# Patient Record
Sex: Female | Born: 1935 | Race: White | Hispanic: No
Health system: Southern US, Community
[De-identification: ages and names within clinical notes are randomized; demographics above are authoritative.]

---

## 1997-09-11 ENCOUNTER — Ambulatory Visit (HOSPITAL_COMMUNITY): Admission: RE | Admit: 1997-09-11 | Discharge: 1997-09-11 | Payer: Self-pay | Admitting: Gastroenterology

## 1998-11-26 ENCOUNTER — Encounter: Payer: Self-pay | Admitting: Orthopedic Surgery

## 1998-12-03 ENCOUNTER — Inpatient Hospital Stay (HOSPITAL_COMMUNITY): Admission: RE | Admit: 1998-12-03 | Discharge: 1998-12-06 | Payer: Self-pay | Admitting: Orthopedic Surgery

## 1998-12-05 ENCOUNTER — Encounter: Payer: Self-pay | Admitting: Orthopedic Surgery

## 1999-03-17 ENCOUNTER — Other Ambulatory Visit: Admission: RE | Admit: 1999-03-17 | Discharge: 1999-03-17 | Payer: Self-pay | Admitting: Obstetrics and Gynecology

## 1999-04-17 ENCOUNTER — Encounter: Payer: Self-pay | Admitting: Family Medicine

## 1999-04-17 ENCOUNTER — Encounter: Admission: RE | Admit: 1999-04-17 | Discharge: 1999-04-17 | Payer: Self-pay | Admitting: Family Medicine

## 1999-04-22 ENCOUNTER — Encounter: Admission: RE | Admit: 1999-04-22 | Discharge: 1999-04-22 | Payer: Self-pay | Admitting: Obstetrics and Gynecology

## 1999-04-22 ENCOUNTER — Encounter: Payer: Self-pay | Admitting: Obstetrics and Gynecology

## 2000-08-15 ENCOUNTER — Emergency Department (HOSPITAL_COMMUNITY): Admission: EM | Admit: 2000-08-15 | Discharge: 2000-08-15 | Payer: Self-pay | Admitting: Emergency Medicine

## 2002-01-10 ENCOUNTER — Ambulatory Visit (HOSPITAL_COMMUNITY): Admission: RE | Admit: 2002-01-10 | Discharge: 2002-01-11 | Payer: Self-pay | Admitting: Cardiovascular Disease

## 2002-01-10 ENCOUNTER — Encounter: Payer: Self-pay | Admitting: Cardiovascular Disease

## 2002-02-14 ENCOUNTER — Encounter: Admission: RE | Admit: 2002-02-14 | Discharge: 2002-02-14 | Payer: Self-pay | Admitting: Family Medicine

## 2002-02-14 ENCOUNTER — Encounter: Payer: Self-pay | Admitting: Family Medicine

## 2002-03-07 ENCOUNTER — Encounter: Admission: RE | Admit: 2002-03-07 | Discharge: 2002-03-07 | Payer: Self-pay | Admitting: Orthopedic Surgery

## 2002-03-07 ENCOUNTER — Encounter: Payer: Self-pay | Admitting: Orthopedic Surgery

## 2003-08-28 ENCOUNTER — Inpatient Hospital Stay (HOSPITAL_COMMUNITY): Admission: AD | Admit: 2003-08-28 | Discharge: 2003-08-29 | Payer: Self-pay | Admitting: Emergency Medicine

## 2005-07-14 ENCOUNTER — Inpatient Hospital Stay (HOSPITAL_COMMUNITY): Admission: EM | Admit: 2005-07-14 | Discharge: 2005-07-23 | Payer: Self-pay | Admitting: Emergency Medicine

## 2005-08-13 ENCOUNTER — Inpatient Hospital Stay (HOSPITAL_COMMUNITY): Admission: EM | Admit: 2005-08-13 | Discharge: 2005-08-22 | Payer: Self-pay | Admitting: Emergency Medicine

## 2005-08-15 ENCOUNTER — Ambulatory Visit: Payer: Self-pay | Admitting: Infectious Diseases

## 2005-08-15 ENCOUNTER — Ambulatory Visit: Payer: Self-pay | Admitting: Internal Medicine

## 2005-08-19 ENCOUNTER — Encounter: Payer: Self-pay | Admitting: Internal Medicine

## 2005-09-30 ENCOUNTER — Ambulatory Visit: Payer: Self-pay | Admitting: Infectious Diseases

## 2005-10-22 ENCOUNTER — Inpatient Hospital Stay (HOSPITAL_COMMUNITY): Admission: EM | Admit: 2005-10-22 | Discharge: 2005-11-03 | Payer: Self-pay | Admitting: Emergency Medicine

## 2005-10-27 ENCOUNTER — Ambulatory Visit: Payer: Self-pay | Admitting: Physical Medicine & Rehabilitation

## 2006-01-12 ENCOUNTER — Inpatient Hospital Stay (HOSPITAL_COMMUNITY): Admission: EM | Admit: 2006-01-12 | Discharge: 2006-01-27 | Payer: Self-pay | Admitting: Orthopedic Surgery

## 2006-01-14 ENCOUNTER — Ambulatory Visit: Payer: Self-pay | Admitting: Infectious Diseases

## 2006-03-23 ENCOUNTER — Inpatient Hospital Stay (HOSPITAL_COMMUNITY): Admission: RE | Admit: 2006-03-23 | Discharge: 2006-03-31 | Payer: Self-pay | Admitting: Orthopedic Surgery

## 2006-04-18 IMAGING — CR DG CHEST 1V PORT
1 series · 1 of 1 positions shown · non-contrast
Comparison: The right PICC line tip is in the distal SVC.  Heart and lungs are stable.

CLINICAL DATA: 77 year-old-female with infected pacemaker, PICC line placement. 
 PORTABLE CHEST - [id number] ? 08/19/05 (8626 HOURS):

[view not recorded]
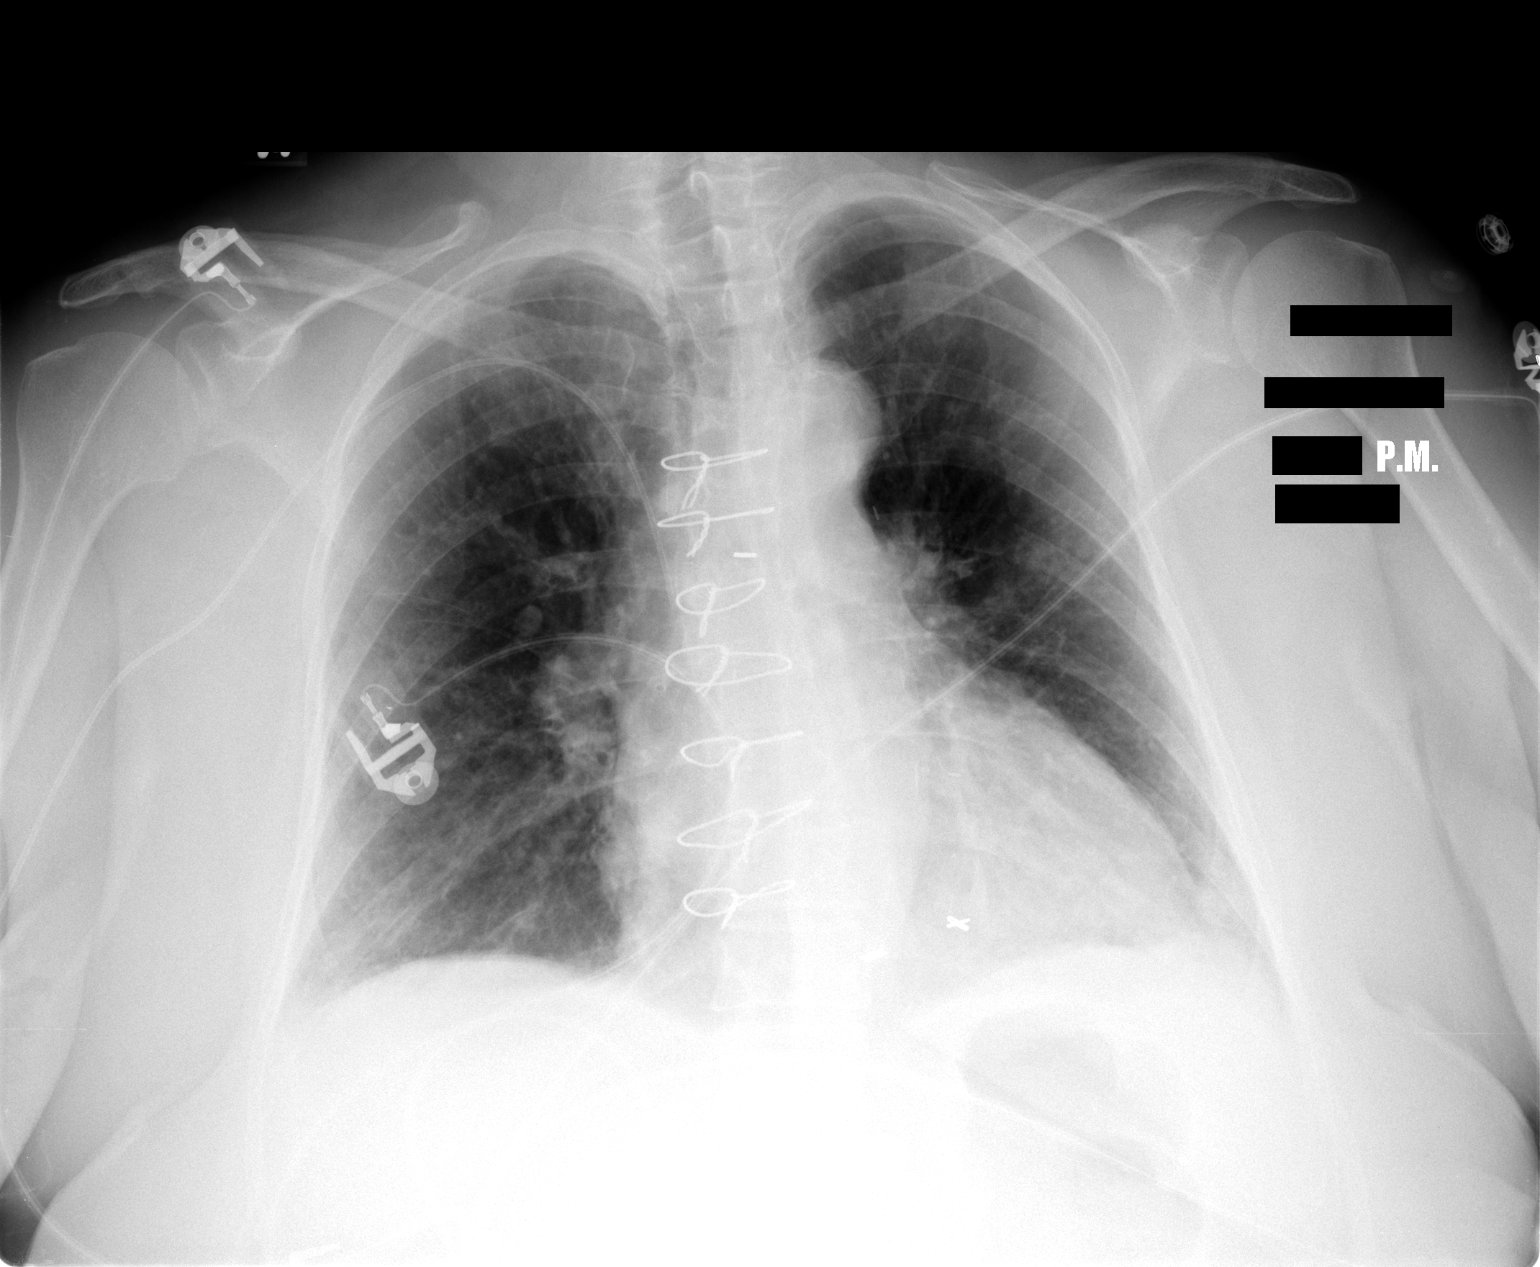

[1 of 1 positions shown; findings below may reference images not displayed]

IMPRESSION: PICC line tip in the distal SVC.

## 2006-04-18 IMAGING — CR DG CHEST 2V
2 series · 2 of 2 positions shown · non-contrast
Comparison: 08/12/05.

CLINICAL DATA: Status post infected pacemaker.  Soreness in chest. 
 CHEST ? 2 VIEW:

[w chest pa]
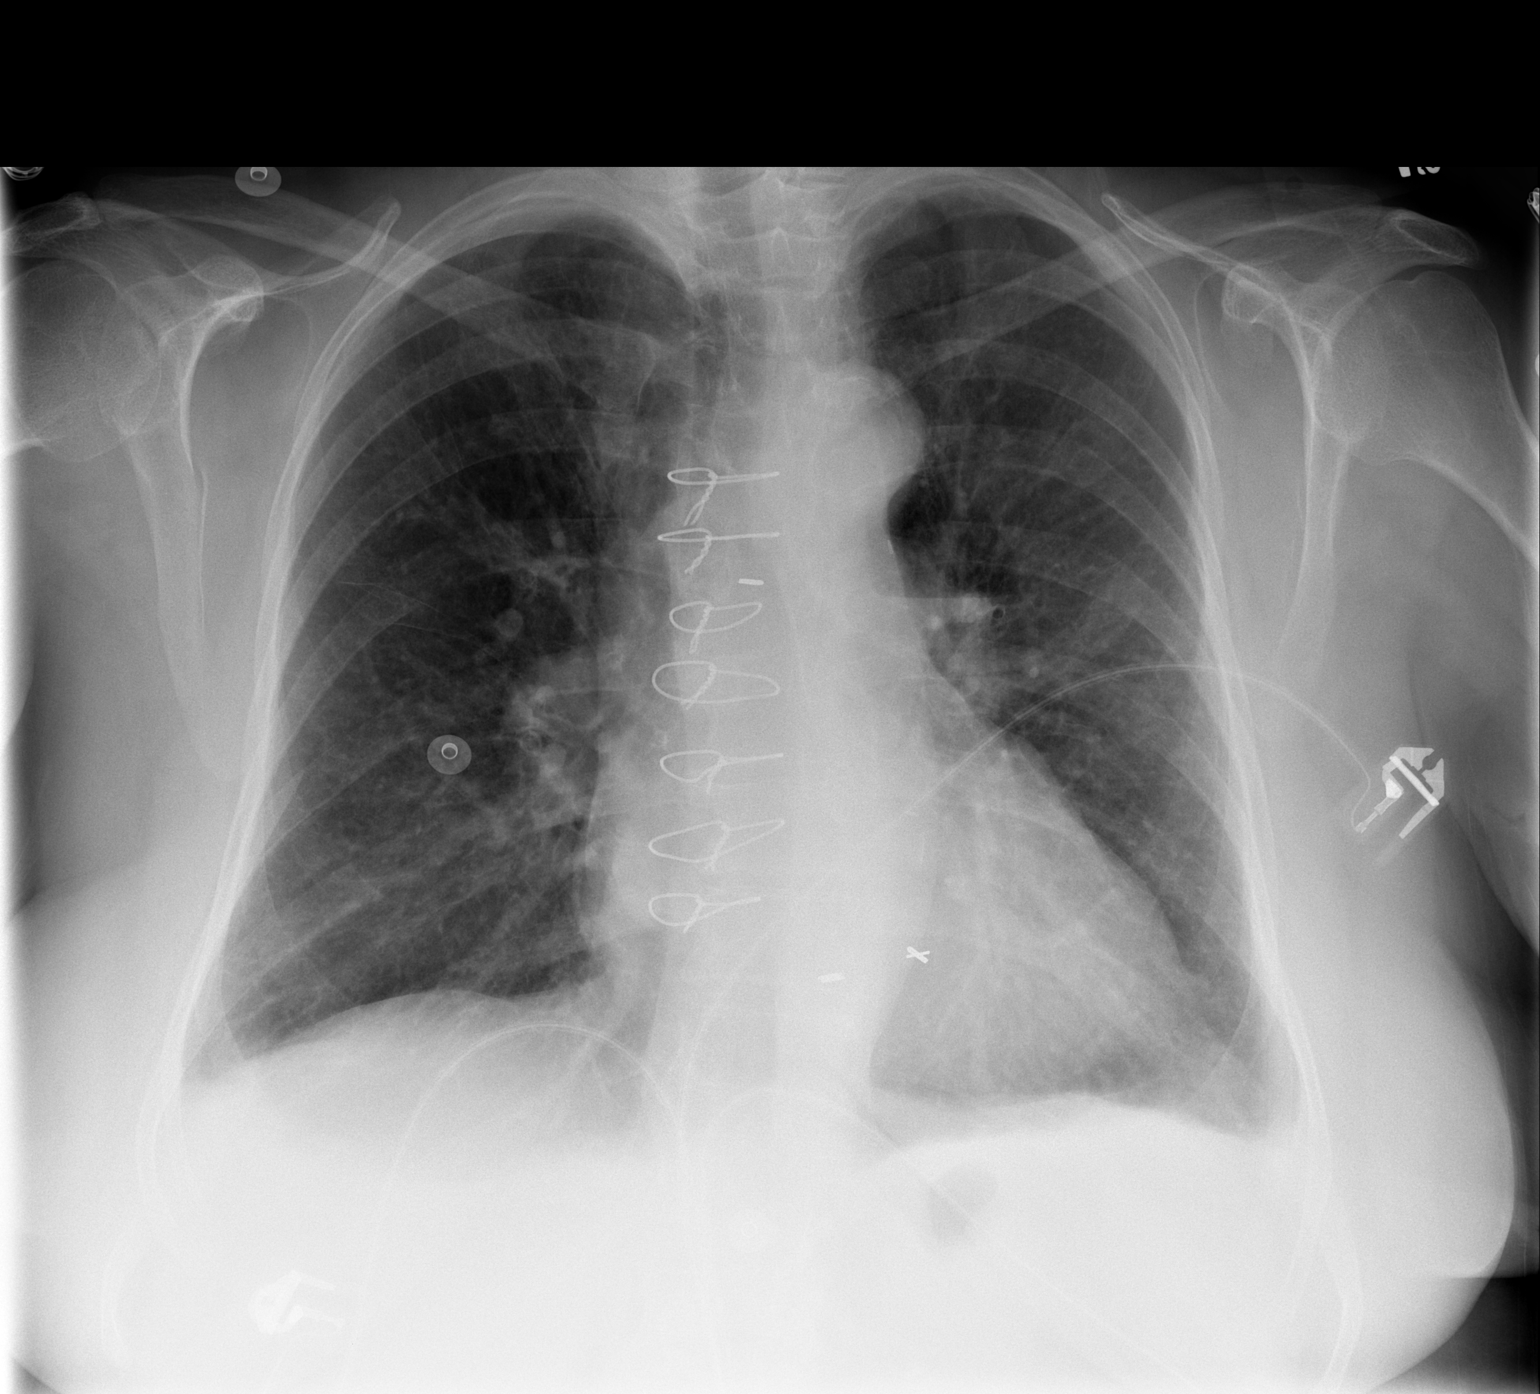

[w chest lat]
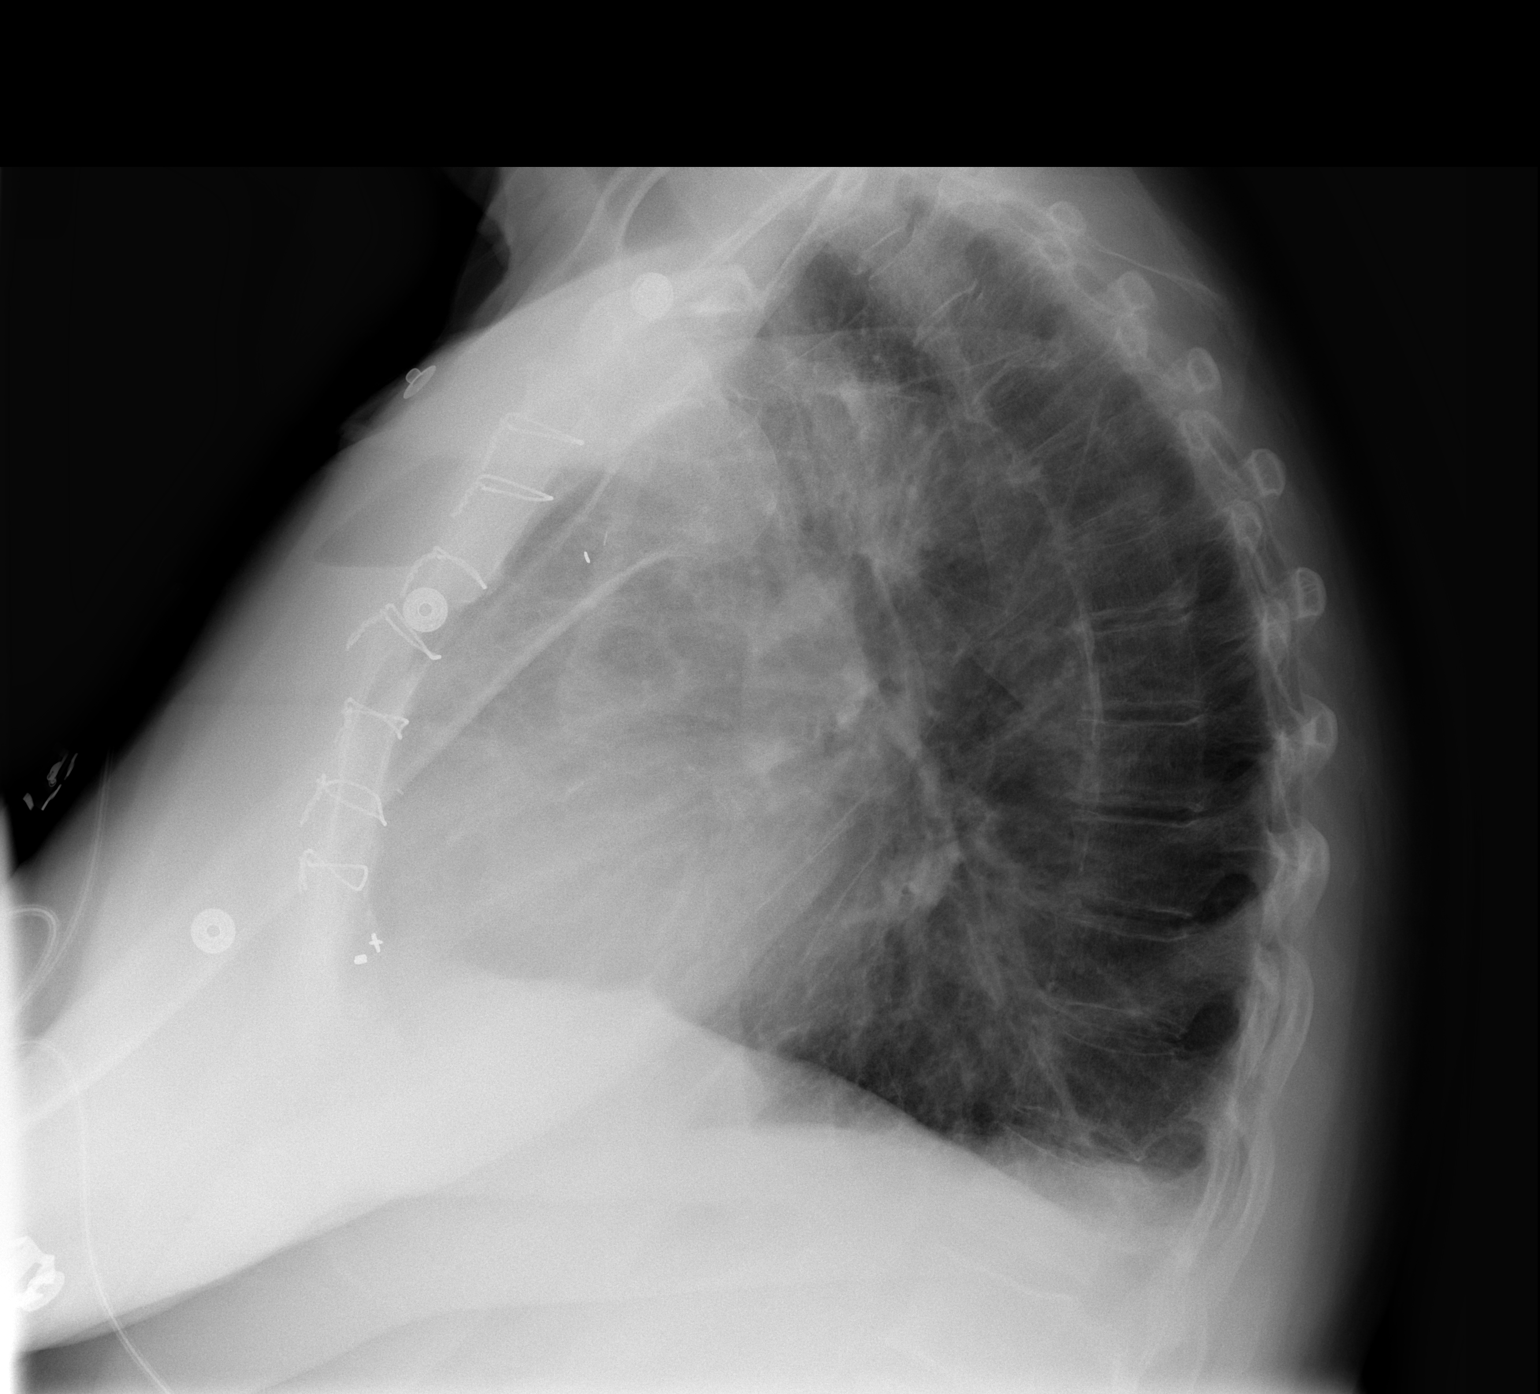

[2 of 2 positions shown; findings below may reference images not displayed]

FINDINGS: Pacemaker and electrodes have been removed.  Cardiomegaly.  Some redistribution of pulmonary blood flow to the upper lung zones compatible with mild pulmonary venous hypertension.  Accentuated peribronchial marking.  No airspace densities.  Blunting of the left posterior costophrenic angle compatible with small effusion.
IMPRESSION: Cardiomegaly and pulmonary venous hypertension.  Minimal left pleural effusion.

## 2006-08-18 DIAGNOSIS — E785 Hyperlipidemia, unspecified: Secondary | ICD-10-CM

## 2006-08-18 DIAGNOSIS — I10 Essential (primary) hypertension: Secondary | ICD-10-CM | POA: Insufficient documentation

## 2006-08-18 DIAGNOSIS — Z9889 Other specified postprocedural states: Secondary | ICD-10-CM

## 2006-08-18 DIAGNOSIS — I4891 Unspecified atrial fibrillation: Secondary | ICD-10-CM | POA: Insufficient documentation

## 2006-08-18 DIAGNOSIS — I359 Nonrheumatic aortic valve disorder, unspecified: Secondary | ICD-10-CM | POA: Insufficient documentation

## 2006-08-18 DIAGNOSIS — E119 Type 2 diabetes mellitus without complications: Secondary | ICD-10-CM | POA: Insufficient documentation

## 2006-08-18 DIAGNOSIS — I25810 Atherosclerosis of coronary artery bypass graft(s) without angina pectoris: Secondary | ICD-10-CM

## 2007-11-02 ENCOUNTER — Inpatient Hospital Stay (HOSPITAL_COMMUNITY): Admission: EM | Admit: 2007-11-02 | Discharge: 2007-11-13 | Payer: Self-pay | Admitting: Emergency Medicine

## 2008-08-09 ENCOUNTER — Encounter: Admission: RE | Admit: 2008-08-09 | Discharge: 2008-08-09 | Payer: Self-pay | Admitting: Internal Medicine

## 2009-08-09 ENCOUNTER — Inpatient Hospital Stay (HOSPITAL_COMMUNITY): Admission: AD | Admit: 2009-08-09 | Discharge: 2009-08-15 | Payer: Self-pay | Admitting: Cardiovascular Disease

## 2010-08-29 LAB — URINALYSIS, ROUTINE W REFLEX MICROSCOPIC
Glucose, UA: 100 mg/dL — AB
Glucose, UA: NEGATIVE mg/dL
Hgb urine dipstick: NEGATIVE
Ketones, ur: NEGATIVE mg/dL
Ketones, ur: NEGATIVE mg/dL
Protein, ur: NEGATIVE mg/dL
Specific Gravity, Urine: 1.011 (ref 1.005–1.030)
pH: 5 (ref 5.0–8.0)
pH: 5 (ref 5.0–8.0)

## 2010-08-29 LAB — GLUCOSE, CAPILLARY
Glucose-Capillary: 102 mg/dL — ABNORMAL HIGH (ref 70–99)
Glucose-Capillary: 110 mg/dL — ABNORMAL HIGH (ref 70–99)
Glucose-Capillary: 121 mg/dL — ABNORMAL HIGH (ref 70–99)
Glucose-Capillary: 122 mg/dL — ABNORMAL HIGH (ref 70–99)
Glucose-Capillary: 124 mg/dL — ABNORMAL HIGH (ref 70–99)
Glucose-Capillary: 124 mg/dL — ABNORMAL HIGH (ref 70–99)
Glucose-Capillary: 132 mg/dL — ABNORMAL HIGH (ref 70–99)
Glucose-Capillary: 143 mg/dL — ABNORMAL HIGH (ref 70–99)
Glucose-Capillary: 143 mg/dL — ABNORMAL HIGH (ref 70–99)
Glucose-Capillary: 148 mg/dL — ABNORMAL HIGH (ref 70–99)
Glucose-Capillary: 164 mg/dL — ABNORMAL HIGH (ref 70–99)
Glucose-Capillary: 169 mg/dL — ABNORMAL HIGH (ref 70–99)
Glucose-Capillary: 176 mg/dL — ABNORMAL HIGH (ref 70–99)
Glucose-Capillary: 181 mg/dL — ABNORMAL HIGH (ref 70–99)
Glucose-Capillary: 205 mg/dL — ABNORMAL HIGH (ref 70–99)
Glucose-Capillary: 205 mg/dL — ABNORMAL HIGH (ref 70–99)

## 2010-08-29 LAB — DIFFERENTIAL
Basophils Absolute: 0 10*3/uL (ref 0.0–0.1)
Basophils Relative: 0 % (ref 0–1)
Eosinophils Absolute: 0 10*3/uL (ref 0.0–0.7)
Eosinophils Relative: 0 % (ref 0–5)
Monocytes Absolute: 0.7 10*3/uL (ref 0.1–1.0)
Monocytes Relative: 8 % (ref 3–12)
Neutro Abs: 8.1 10*3/uL — ABNORMAL HIGH (ref 1.7–7.7)

## 2010-08-29 LAB — COMPREHENSIVE METABOLIC PANEL
ALT: 10 U/L (ref 0–35)
Albumin: 3.5 g/dL (ref 3.5–5.2)
Alkaline Phosphatase: 68 U/L (ref 39–117)
BUN: 24 mg/dL — ABNORMAL HIGH (ref 6–23)
Chloride: 106 mEq/L (ref 96–112)
Potassium: 5.7 mEq/L — ABNORMAL HIGH (ref 3.5–5.1)
Sodium: 134 mEq/L — ABNORMAL LOW (ref 135–145)
Total Bilirubin: 0.6 mg/dL (ref 0.3–1.2)
Total Protein: 6.4 g/dL (ref 6.0–8.3)

## 2010-08-29 LAB — CBC
HCT: 34.9 % — ABNORMAL LOW (ref 36.0–46.0)
Hemoglobin: 10.3 g/dL — ABNORMAL LOW (ref 12.0–15.0)
Hemoglobin: 10.5 g/dL — ABNORMAL LOW (ref 12.0–15.0)
Hemoglobin: 10.6 g/dL — ABNORMAL LOW (ref 12.0–15.0)
Hemoglobin: 11.7 g/dL — ABNORMAL LOW (ref 12.0–15.0)
MCHC: 33.9 g/dL (ref 30.0–36.0)
MCHC: 33.9 g/dL (ref 30.0–36.0)
MCHC: 34.2 g/dL (ref 30.0–36.0)
MCHC: 34.5 g/dL (ref 30.0–36.0)
MCV: 105 fL — ABNORMAL HIGH (ref 78.0–100.0)
MCV: 105.4 fL — ABNORMAL HIGH (ref 78.0–100.0)
MCV: 105.6 fL — ABNORMAL HIGH (ref 78.0–100.0)
Platelets: 221 10*3/uL (ref 150–400)
Platelets: 224 10*3/uL (ref 150–400)
Platelets: 275 10*3/uL (ref 150–400)
RBC: 2.77 MIL/uL — ABNORMAL LOW (ref 3.87–5.11)
RBC: 2.93 MIL/uL — ABNORMAL LOW (ref 3.87–5.11)
RBC: 3 MIL/uL — ABNORMAL LOW (ref 3.87–5.11)
RDW: 16.3 % — ABNORMAL HIGH (ref 11.5–15.5)
RDW: 16.8 % — ABNORMAL HIGH (ref 11.5–15.5)
RDW: 16.8 % — ABNORMAL HIGH (ref 11.5–15.5)
RDW: 17.1 % — ABNORMAL HIGH (ref 11.5–15.5)
RDW: 17.4 % — ABNORMAL HIGH (ref 11.5–15.5)
WBC: 6 10*3/uL (ref 4.0–10.5)
WBC: 6.8 10*3/uL (ref 4.0–10.5)
WBC: 8.3 10*3/uL (ref 4.0–10.5)
WBC: 9.6 10*3/uL (ref 4.0–10.5)
WBC: 9.9 10*3/uL (ref 4.0–10.5)

## 2010-08-29 LAB — BASIC METABOLIC PANEL
BUN: 22 mg/dL (ref 6–23)
BUN: 24 mg/dL — ABNORMAL HIGH (ref 6–23)
CO2: 20 mEq/L (ref 19–32)
CO2: 25 mEq/L (ref 19–32)
CO2: 26 mEq/L (ref 19–32)
Calcium: 9.3 mg/dL (ref 8.4–10.5)
Calcium: 9.3 mg/dL (ref 8.4–10.5)
Calcium: 9.4 mg/dL (ref 8.4–10.5)
Calcium: 9.4 mg/dL (ref 8.4–10.5)
Calcium: 9.5 mg/dL (ref 8.4–10.5)
Chloride: 102 mEq/L (ref 96–112)
Chloride: 105 mEq/L (ref 96–112)
Chloride: 111 mEq/L (ref 96–112)
Creatinine, Ser: 1.18 mg/dL (ref 0.4–1.2)
Creatinine, Ser: 1.22 mg/dL — ABNORMAL HIGH (ref 0.4–1.2)
Creatinine, Ser: 1.23 mg/dL — ABNORMAL HIGH (ref 0.4–1.2)
GFR calc Af Amer: 27 mL/min — ABNORMAL LOW (ref >60)
GFR calc Af Amer: 42 mL/min — ABNORMAL LOW (ref >60)
GFR calc Af Amer: 49 mL/min — ABNORMAL LOW (ref >60)
GFR calc Af Amer: 51 mL/min — ABNORMAL LOW (ref >60)
GFR calc Af Amer: 51 mL/min — ABNORMAL LOW (ref >60)
GFR calc Af Amer: 53 mL/min — ABNORMAL LOW (ref >60)
GFR calc non Af Amer: 23 mL/min — ABNORMAL LOW (ref >60)
GFR calc non Af Amer: 26 mL/min — ABNORMAL LOW (ref >60)
GFR calc non Af Amer: 35 mL/min — ABNORMAL LOW (ref >60)
Glucose, Bld: 111 mg/dL — ABNORMAL HIGH (ref 70–99)
Glucose, Bld: 122 mg/dL — ABNORMAL HIGH (ref 70–99)
Potassium: 5.2 mEq/L — ABNORMAL HIGH (ref 3.5–5.1)
Sodium: 133 mEq/L — ABNORMAL LOW (ref 135–145)
Sodium: 134 mEq/L — ABNORMAL LOW (ref 135–145)
Sodium: 138 mEq/L (ref 135–145)
Sodium: 140 mEq/L (ref 135–145)

## 2010-08-29 LAB — URINE MICROSCOPIC-ADD ON

## 2010-08-29 LAB — URINE CULTURE: Colony Count: NO GROWTH

## 2010-08-29 LAB — HEPARIN LEVEL (UNFRACTIONATED): Heparin Unfractionated: 0.5 IU/mL (ref 0.30–0.70)

## 2010-08-29 LAB — CARDIAC PANEL(CRET KIN+CKTOT+MB+TROPI)
CK, MB: 0.9 ng/mL (ref 0.3–4.0)
CK, MB: 0.9 ng/mL (ref 0.3–4.0)
Relative Index: INVALID (ref 0.0–2.5)
Total CK: 18 U/L (ref 7–177)
Total CK: 25 U/L (ref 7–177)

## 2010-08-29 LAB — BRAIN NATRIURETIC PEPTIDE
Pro B Natriuretic peptide (BNP): 206 pg/mL — ABNORMAL HIGH (ref 0.0–100.0)
Pro B Natriuretic peptide (BNP): 224 pg/mL — ABNORMAL HIGH (ref 0.0–100.0)
Pro B Natriuretic peptide (BNP): 225 pg/mL — ABNORMAL HIGH (ref 0.0–100.0)
Pro B Natriuretic peptide (BNP): 365 pg/mL — ABNORMAL HIGH (ref 0.0–100.0)
Pro B Natriuretic peptide (BNP): 429 pg/mL — ABNORMAL HIGH (ref 0.0–100.0)

## 2010-08-29 LAB — APTT: aPTT: 97 seconds — ABNORMAL HIGH (ref 24–37)

## 2010-08-29 LAB — PROTIME-INR
INR: 1.06 (ref 0.00–1.49)
Prothrombin Time: 13.7 seconds (ref 11.6–15.2)

## 2010-08-29 LAB — TROPONIN I: Troponin I: 0.03 ng/mL (ref 0.00–0.06)

## 2010-08-29 LAB — TSH: TSH: 2.247 u[IU]/mL (ref 0.350–4.500)

## 2010-08-29 LAB — HEMOGLOBIN A1C: Mean Plasma Glucose: 151 mg/dL

## 2010-08-29 LAB — MRSA PCR SCREENING: MRSA by PCR: NEGATIVE

## 2010-10-21 NOTE — Consult Note (Signed)
[name], [name]               ACCOUNT NO.:  [id number]   MEDICAL RECORD NO.:  [id number]          PATIENT TYPE:  INP   LOCATION:  2010                         FACILITY:  [facility]   PHYSICIAN:  Deanna Artis. Hickling, M.D.DATE OF BIRTH:  10-16-1927   DATE OF CONSULTATION:  11/07/2007  DATE OF DISCHARGE:                                 CONSULTATION   CHIEF COMPLAINT:  1. Altered mental status.  2. Problems with language.   I was asked to see Evelin Cake, a 77 year old widowed woman who  previously was seen at [facility] by my partner, Dr.  Fransisca Connors, April 15, 2001.  At that time, she had problems with  her balance, tendency to fall, and tremors that affected her hands, more  so, than her body.   He concluded that the patient had benign essential tremor and that she  had organic gait disorder, in part related to diabetic neuropathy.   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  This occurred during a coronary  artery stent placement.  The patient had made good recovery from that.   The patient was recently seen in Dr. Kandis Cocking office, Nov 02, 2007.  She complained of a 75-month history of shortness of breath, paroxysmal  nocturnal dyspnea, and was sleeping in recliner.  She complained of low  energy and lower extremity swelling.   During the examination, the patient became less coherent and her  information was inconsistent.  This confusion persisted in the hospital.  Indeed, the patient had difficulty talking about her medications and in  particular, had confusion about whether or not she was on insulin.   As result of this, I was asked to see her to determine the etiology of  her dysfunction and make recommendations for further workup and  treatment.   The patient has a number of risk factors for stroke including atrial  fibrillation, sick sinus syndrome, hypertension,  dyslipidemia, type 2  diabetes mellitus, and valvular heart disease with aortic stenosis and  moderate mitral valvular regurgitation.   The patient has other medical problems including a staph infection, that  forced removal of her pacemaker.  The patient has had a fairly stable  heart rhythm since that time, but has shown very long pauses during this  hospitalization, ranging from 2.6 seconds to somewhat over 3 seconds.   REVIEW OF SYSTEMS:  Remarkable for lightheadedness and near syncope.  She has not had intercurrent infections to head, neck lungs, GI, GU,  rash, or any new bruise, diabetes, or thyroid disease.  No dysuria,  hematuria, urgency, increased frequency of urination, cough, nasal  congestion, sore throat, or fever.   Twelve-system review was otherwise negative.   MEDICATIONS:  1. Cardizem 180 mg daily.  2. Avapro 300 mg daily.  3. Lasix 80 mg daily.  4. Lexapro 10 mg daily.  5. Protonix 40 mg daily.  6. Nu-Iron 150 mg twice daily.  7. Potassium chloride 10 mEq twice daily.  8. Metformin 1000 mg twice daily.  9. Glipizide  10 mg daily.  10.Crestor 20 mg daily.  11.Coumadin 5 mg, Saturday, Tuesday, and Thursday and 2.5 mg, Monday,      Wednesday, Friday, and Sunday.  12.Flonase.  13.Albuterol inhaler.  14.Vicodin 5/500 mg p.r.n.  15.Imdur 15 mg daily.   ALLERGIES:  PENICILLIN.   FAMILY HISTORY:  Positive for coronary artery disease, stroke, and  hypertension.   SOCIAL HISTORY:  The patient is widowed.  Her husband died in the 58s.  The patient has 2 children, 2 grandchildren, and 1 great grandchild.  She does not exercise.  She does not smoke and does not use alcohol or  drugs.   PHYSICAL EXAMINATION:  GENERAL:  On examination today, this is a  pleasant woman sitting in recliner in no distress.  VITAL SIGNS:  Blood pressure 110/60, resting pulse 63, respirations 18,  temperature 97.7, and oxygen saturation 97% on room air.  EARS, NOSE, AND THROAT:  No  infections.  LUNGS:  Clear.  HEART:  No murmurs.  Pulses normal.  ABDOMEN:  Soft.  Bowel sounds are normal.  No hepatosplenomegaly.  EXTREMITIES:  Normal.  NEUROLOGIC:  Mental status:  The patient had mini-mental status exam of  27/30, clock drawing 5/5, and animal naming 7 or 17 is normal.  Cranial  nerves:  Status post bilateral iridectomies.  Symmetric facial strength.  Visual fields full.  Extraocular movements full.  Symmetric facial  strength.  Midline tongue.  Motor examination:  Normal strength, tone,  and mass.  Good fine motor movements.  No pronator drift.  Sensation  showed a mild peripheral neuropathy.  No hemisensory.  Good  stereoagnosis.  Cerebellar examination:  Good finger-to-nose, rapid  alternating movements.  Gait was not tested.  Deep tendon reflexes were  absent.  The patient had bilateral flexor plantar responses.   IMPRESSION:  1. 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  2. I doubt that she has had a stroke.  Her NIH stroke scale is 0.  3. CT scan was reviewed and suggests a remote right posterior frontal      infarction and a left anterior limb of the internal capsule      infarction.  4. She has diffuse white matter disease and mild cortical atrophy.   RECOMMENDATIONS:  MRI will reveal that the patient has had a silent  acute subacute stroke.  She has multiple risk factors for stroke, that I  have noted above.  Once the pacemaker is placed and it may be necessary  to replace it, an MRI would be impossible.  She should also have an MRA  if MRI shows evidence of stroke.   I appreciate the opportunity to participate in her care.  If you have  any questions or I could be of assistance, do not hesitate to contact  me.      Deanna Artis. Sharene Skeans, M.D.  Electronically Signed     WHH/MEDQ  D:  11/07/2007  T:  11/08/2007  Job:  [id number]   cc:   Gerlene Burdock A. Alanda Amass, M.D.

## 2010-10-21 NOTE — Discharge Summary (Signed)
NAMEDONNALYNN, WHEELESS               ACCOUNT NO.:  [id number]   MEDICAL RECORD NO.:  [id number]          PATIENT TYPE:  INP   LOCATION:  2010                         FACILITY:  [facility]   PHYSICIAN:  Ritta Slot, MD     DATE OF BIRTH:  Jun 04, 1928   DATE OF ADMISSION:  11/02/2007  DATE OF DISCHARGE:  11/13/2007                               DISCHARGE SUMMARY   Ms. Magner is a 77 year old patient of Dr. Susa Griffins, who was  seen in the office and admitted for altered mental status and acute on  chronic CHF.  It was felt, she may have had a CVA with a prior medical  history of CVA.  She was admitted.  Her BNP was not elevated.  A neuro  consult was called.  She underwent an MRI, which did not show any acute  stroke.  She did have small vessel disease.  She has a history of  chronic atrial fib with sick sinus syndrome.  In the hospital, she had  tachybrady syndrome with 3-second and greater pauses.  We tried to  adjust her medications; however, it was just not able to be controlled,  thus it was decided that she should undergo pacemaker implantation.  Previously, she did have a pacemaker implanted; however, she apparently  got infected and had to be explanted, that was in July 21, 2005,  removal was on August 18, 2005.  She underwent pacemaker insertion on  November 11, 2007, by Dr. Ritta Slot.  She had a [employer] placed,  single chamber PPM on the left postsubclavian area.  Post procedure, she  did well.  She was placed on metoprolol and diltiazem to control her  rapid rate.  We had obtained a urine culture prior to her pacemaker  insertion.  It came back positive for Enterococcus.  She received about  3 days of vancomycin before and after her pacemaker inserted and she was  sent home on Septra DS for a total of 7 days.  We told her to restart  her Coumadin on November 15, 2007, had a lower dose of 2.5 mg every day.  She  will see Dr. Lynnea Ferrier back for just pacer and wound site  check, and then  she will follow up with a regular cardiologist, Dr. Alanda Amass after  that.  On the day of discharge, her blood pressure was 111/60 and heart  rate was 72.  She was ventricular pacing on demand, respirations in the  18 and temperature 97.1.   LABORATORY DATA:  Urine culture was greater than a 100,000 for  Enterococcus.  Sensitivities were pending at the time of discharge.  Sodium was 140, potassium 4.0, chloride 109, CO2 of 28, BUN 20, and  creatinine 1.18.  Her hemoglobin was 10.9, hematocrit 32.6, WBCs 8.3,  and platelets were 255.  Homocystine was elevated at 23.1 and hemoglobin  A1c was 7.2.  TSH was 2.681.  CK-MBs and troponins were all negative.  Magnesium was 2.5.   DISCHARGE MEDICATIONS:  1. Avapro 300 mg a day.  2. Lexapro 10 mg a day.  3. Nu-Iron  150 mg twice per day.  4. K-dur 10 mEq twice a day.  5. Crestor 20 mg a day.  6. Protonix 40 mg a day.  7. Metformin 1000 mg twice a day.  8. Imdur 15 mg a day.  9. Glucotrol 10 mg a day.  10.Furosemide 60 mg a day.  11.Metoprolol 25 mg twice per day.  12.Diltiazem ER 120 mg daily.  13.Bactrim DS 1 twice per day x5 days.  14.She is to restart her Coumadin/warfarin in the night of November 15, 2007.  She should start taking 2.5 mg a day.  She should have her      protime checked on November 21, 2007.  She will see Dr. Lynnea Ferrier back on      November 21, 2007, just for wound site check, after that she will see      Dr. Alanda Amass in followup.  If she has any problems we told her to      call.  She was given a pacemaker instruction sheet and this was      explained to her and her son, her medicines, etc.  Her son does fix      her medicines.  In followup, she should be placed on some Foltx      because of the elevated homocysteine of 23.   DISCHARGE DIAGNOSES:  1. Altered mental status, resolved at time of discharge, seen by      Neurology.  No acute stroke.  MRI done on November 08, 2007, which      showed no acute infarct.   It showed atrophy, moderate small vessel      disease, nonspecific small area of blood breakdown products, right      occipital lobe, as noted above.  2. Chronic atrial fibrillation with sick sinus syndrome, status post      permanent pacemaker by Dr. Ritta Slot.  She had a [employer] Jude      single chamber implanted.  3. Urinary tract infection with greater than 100,000 Enterococcus.      Sensitivities are pending at the time of this discharge and she is      placed on Septra DS for a total of 7 days.  She did receive 3 days      of vancomycin in the hospital.  4. Acute on chronic diastolic congestive heart failure, resolved at      time of discharge.  5. History of infected permanent pacemaker and explantation in 2007.  6. Normal ejection fraction.  7. History of coronary artery disease with history of coronary artery      bypass grafting in 1989.      Lezlie Octave, N.P.      Ritta Slot, MD  Electronically Signed    BB/MEDQ  D:  11/13/2007  T:  11/14/2007  Job:  [id number]

## 2010-10-21 NOTE — Op Note (Signed)
[name], Lowe               ACCOUNT NO.:  [id number]   MEDICAL RECORD NO.:  [id number]          PATIENT TYPE:  INP   LOCATION:  2010                         FACILITY:  [facility]   PHYSICIAN:  Ritta Slot, MD     DATE OF BIRTH:  May 24, 1928   DATE OF PROCEDURE:  11/11/2007  DATE OF DISCHARGE:                               OPERATIVE REPORT   This is a single chamber permanent pacemaker insertion.   ATTENDING PHYSICIAN:  Ritta Slot, MD   INDICATIONS:  [name] is a very pleasant 77 year old female,  patient of Dr. Susa Griffins, who has chronic atrial fibrillation  and was admitted to the hospital on Nov 02, 2007, with mild confusion.  During her hospitalization, [name] was found to have rapid atrial  fibrillation with significant pauses of up to 4 seconds.  Treatment of  her atrial fibrillation was found to be challenging with negative  inotropic agents in view of the fact that [name] had pauses.  [name] was  therefore brought to the cardiac catheterization laboratory for single  chamber permanent pacemaker insertion.   Of note, [name] has had prior permanent pacemaker insertion in 2007 for the  same but had to have explantation of the pacemaker within a month due to  infection of the pacemaker and leads.   DESCRIPTION OF OPERATION:  After informed consent, the patient was then  brought to cardiac cath lab where the left chest was prepped and draped  in sterile fashion.  ECG monitoring was established.  Venography was  performed to identify that the left subclavian vein was in fact patent  despite having had a pacemaker placed 2 years ago.  Lidocaine 1% was  then used in each side of the left mid subclavicular area.  [name] was  given 5 mg of diazepam and 25 mcg fentanyl intravenously for light  sedation.  Next, approximately 3-cm horizontal and mid infraclavicular  incision was then carried out and hemostasis was obtained with  electrocautery.  Blunt dissection was used  that was carried down to  pectoral fascia.  Next, approximately 3 x 4 cm pocket was then created  over the left pectoral fascia, and again hemostasis was obtained with  cautery.  Left subclavian vein was then easily entered with 1 retained  guidewire in the left subclavian vein.  Over the retaining guidewire a 7-  [place] dilator was then easily passed and dilator and guide wire  removed.  Through a 52-cm [employer] Tendril SDX Model [id number] serial  [id number] lead was then easily passed into the right atrium.  The peel-  away sheath was then removed.  The lead passed easily into the left  ventricle and was positioned beautifully in the right ventricular apex.  We were able to capture 2 volts and screws and extended and the  thresholds were determined.  R-waves were measured at 15.4 mV.  Threshold was found to be 0.7 volts to 0.5 msec.  Impedance was 644  ohms.  Current was 0.9 mA.  These were then sutured into place with 2-  silk sutures, anchoring next to the pectoral  fascia.  The pocket was  then copiously irrigated with 1% kanamycin solution and hemostasis  confirmed.  The leads were then connected in serial fashion to the  [facility] XL SR model [id number] serial [id number] pacemaker.  The lead was then  tightened to the pacemaker with head screws and the pacing was  confirmed.  A single suture was placed in the superior aspects of the  pocket.  The leads and generators were then delivered into the pocket.  Surgicel was then put into the pocket for further hemostasis.  The  subcutaneous layer was then closed with 2-0 Vicryl.  The skin was then  closed in 4-0 Vicryl.  Steri-Strips were applied.  The patient returned  to the recovery room in stable condition.   CONCLUSION:  Successful implant of a [employer]. Jude Zephyr XL SR model [id number]  serial [id number] generator with active ventricular leads.      Ritta Slot, MD  Electronically Signed     HS/MEDQ  D:  11/11/2007  T:  11/12/2007  Job:   [id number]

## 2010-10-24 NOTE — Discharge Summary (Signed)
NAMESHALENA, [name]               ACCOUNT NO.:  [id number]   MEDICAL RECORD NO.:  [id number]          PATIENT TYPE:  INP   LOCATION:  1431                         FACILITY:  [facility]   PHYSICIAN:  Madlyn Frankel. Charlann Boxer, M.D.  DATE OF BIRTH:  Jan 12, 1928   DATE OF ADMISSION:  03/23/2006  DATE OF DISCHARGE:                                 DISCHARGE SUMMARY   ADDENDUM:  Previous job number was [id number].   Patient was originally discharged by orthopedics on March 29, 2006,  however, cardiology wanted to follow her due to an irregular heart rhythm  and decided to observe for several days.  They adjusted her medications and  found her to be medically and cardiovascularly stable. New medications were  given per cardiology.  On March 31, 2006, she continued to be  orthopedically stable.  Wound was clean, dry and intact.  It was determined  she was ready to be discharged to [facility].     ______________________________  Yetta Glassman. Loreta Ave, [place]      Madlyn Frankel. Charlann Boxer, M.D.  Electronically Signed    BLM/MEDQ  D:  03/31/2006  T:  03/31/2006  Job:  [id number]

## 2010-10-24 NOTE — Op Note (Signed)
[name], [name]               ACCOUNT NO.:  [id number]   MEDICAL RECORD NO.:  [id number]          PATIENT TYPE:  INP   LOCATION:  1431                         [facility]ACILITY:  [facility]   PHYSICIAN:  Madlyn Frankel. Charlann Boxer, M.D.  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   PROCEDURES:  1. Removal of spacer and deep implant.  2. Reimplantation of left total knee replacement.   COMPONENTS USED:  DePuy Revision Knee System with size 3 femur.  A 5 degree  valgus with a +2 bolt and a 30 mm cemented stem with a 20 mm cemented  femoral stem.  I used a 8 mm augment medially and then a 4 mm laterally.   The tibia was a size was a size 3 MBT revision tray with a 10 mm posterior  stabilized insert and a 38 patellar button.   SURGEON:  Madlyn Frankel. Charlann Boxer, M.D.   ASSISTANT:  Yetta Glassman. Loreta Ave, PA-C.   ANESTHESIA:  General.   BLOOD LOSS:  200 mL.   DRAINS:  One.   COMPLICATIONS:  None.   INDICATIONS FOR PROCEDURE:  Ms. Ponzo is a very pleasant 77 year old  female who has a history of left total knee replacement that was performed  by one of my partners.  She had initially done well until pacemaker that she  had placed for a an arrhythmia became infected.  She had an acute infection  with subsequently attempted to I&D, failed to provide long-term result.  She  was referred for definitive treatment.  She is now about 8 weeks out from an  I&D and removal of the components and treatment with antibiotics.   The lab values indicated correction of her inflammatory response, surgery  scheduled.  I reviewed with her the risks and benefits of surgery including  reinfection and persistent pain, recurrence of infection and DVT, stiffness  and the postoperative expectations.   PROCEDURE IN DETAIL:  The patient was brought to the operative theater.  Once adequate anesthesia and preoperative antibiotics, 1 g of Ancef, were  administered, given the fact that the patient had a methicillin-sensitive  staph aureus infection.  The left lower extremity was then prepped and  draped in sterile fashion following prescrub, a proximal thigh tourniquet  placed.  The midline incision was made followed by median parapatellar  arthrotomy and old sutures removed going into the wound.  Exposure was  obtained including recreating suprapatellar pouch medial lateral gutters.  The patella was everted, put in a smooth pin in the tibial tubercle to  prevent an avulsion.  Once I had knee exposure I removed the cement blocks  including the one between the tibial femoral joint, the one in the  suprapatellar pouch and then two inserted into the canal, the femur and  tibia.  Debridement was carried out to remove any synovial  lining.   At this point, the canals were opened.  I hand-reamed to a 15 on the tibia  and hand reamed up to a 17 on the femur.   Following this, attention was directed to the tibial surface.  It looked as  if the size of the femur could either be a size 3 or size 4.  With a  extramedullary device, I resected the minimal amount of bone off the  proximal tibia, amounting to 2 mm at spots.  Further debridement was carried  out.   At this point, the cut surface was felt that a size 3 tibial tray would fit  examination on here with excellent bony coverage.  It was centered based on  the position of an intramedullary rod into the canal and then pinned into  position.  An alignment rod was she was passed down the tray handle to  determine whether or not the cut was perpendicular and it was.   The final drilling and keel punch was carried out on this tibial surface.   Following tibial preparation, attention was directed to the femur.   Femoral  preparation was carried out per protocol.  This included placement of  intramedullary rod with size 13 reamer.  I did put a 20 mm sleeve in here to  hold it and orient it into the central portion the canal.  A distal cut was  analyzed and cleaned up on the medial side primarily and nothing laterally.  This kept the joint line where it was.  Following this I sized again the  femur and felt that a size 3 or 4 would work fine.  We went ahead and tried  with a 3, that way if I needed extra flexion stability, I could go up to a  4.  With a size 3 cutting block, the anterior and posterior cuts were made  posteriorly and based on the feel of bone, I decided that I would use a 4 mm  augment laterally and 8 mm augment laterally.  A small amount of bone was  resected off each of these cuts.   The final box cut was made.  Chamfer cuts were assessed but were airballs.  Trial then was carried out with a size 3 tibial tray in place, a size 3  femur with a 20 mm cemented sleeve, 30 mL cemented stem.  The knee came to  full extension and flexed with nice stability.   With these trial components in place, attention was directed to the patella.  The patella cut previously with removal of hardware was to 12-13 mm.  This  was cleaned up and debrided of any fibrinous debris and drill holes  freshened up.  A size 38 patellar button allowed for some restoration of  patella height.  The patella was noted to track without any application of  thumb pressure.  At this point, final components were brought to the field  as noted above.  The trial components removed and cement restricters placed  according to the depth of the femoral and tibial components.   The knee was copiously irrigated with normal saline solution pulse lavage  and then packed dried.  Final components were brought into the field and the  cement was mixed.  I mixed two bags of cement with 1 g of vancomycin per bag for the tibia and the  same for the femur.   When the cement was ready and the final components had been prepared on  the  back table, the tibial component was cemented first, using a gun to  retrograde fill the femoral canal.  The final femoral component was then  placed.  Excessive cement was debrided, 10 mm polyethylene insert was placed  and the knee brought to extension, patella was cemented in position and  patella clamp held in place.   Once the cement had cured, excessive cement was debrided throughout the knee  particularly posteriorly.   Once I was satisfied this was clean, the final 10 mm posterior stabilized  insert was placed into the tibial tray and the knee reduced.  The knee was  copiously irrigated with pulse lavage again at this point.  Medium Hemovac  drain was placed deep.  The extensor mechanism was reapproximated using a #1  PDS.  The remainder of the wound was closed with 2-0 Vicryl and staples on  the skin.  The skin was cleaned, dried and dressed sterilely with Adaptic  dressing sponge and tape.  The patient was brought to the recovery room and  a sterile bulky dressing with ice pack.      Madlyn Frankel Charlann Boxer, M.D.  Electronically Signed     MDO/MEDQ  D:  03/24/2006  T:  03/26/2006  Job:  [id number]

## 2010-10-24 NOTE — H&P (Signed)
[name]               ACCOUNT NO.:  [id number]   MEDICAL RECORD NO.:  [id number]          PATIENT TYPE:  INP   LOCATION:  1408                         FACILITY:  [facility]   PHYSICIAN:  Sherin Quarry, MD      DATE OF BIRTH:  01-26-1928   DATE OF ADMISSION:  08/12/2005  DATE OF DISCHARGE:                                HISTORY & PHYSICAL   [name] is a 77 year old lady who states that about 2 days ago she  began experienced dysuria and urinary frequency. Today her son noted that  she was quite lethargic and did not seem to want to get out of bed. He  called her from work on two occasions and she did not answer the phone.  Therefore, he came home from work and found her sitting in a chair and  seeming to be somewhat confused and lethargic. She denied having any pain.  She had no apparent breathing difficulty or chest pain. There is no nausea,  vomiting or diarrhea. [name] was therefore brought to the emergency  room at [facility] where her blood pressure was noted to be 129/63, heart  rate 112 and irregular, temperature was 101.3. Studies done so far show that  she has rather profound pyuria with too numerous to count white cells and  many bacteria. Her white count is 13,400; hemoglobin is 13.3. Glucose is  187, creatinine is 1.6. She is admitted at this time for treatment of  presumed urinary tract infection.   Recent history is quite significant in that the patient was discharged from  [facility] on July 23, 2005. During that hospitalization she  underwent a cardiac catheterization with stenting of the left circumflex, as  well as a placement of a permanent transvenous pacemaker. During that  hospitalization a Foley catheter was in place at least part of the time  according to her son.   PAST MEDICAL HISTORY:  Current medications - these consist of:  1.  Plavix 75 mg daily.  2.  Zantac 300 mg daily.  3.  Coumadin which the patient is taking as per  instructions to maintain INR      between 2 and 3. The exact current dose of Coumadin is not known to her      son.  4.  She is also on Fosamax 70 mg weekly.  5.  Toprol-XL 50 mg b.i.d.  6.  Diovan 160 mg daily.   Her chronic medications which include:  1.  Metformin 1 g b.i.d.  2.  Glipizide 10 mg daily.  3.  Lovastatin 40 mg daily.  4.  Albuterol 4 mg p.r.n.  5.  Lasix 40 mg daily.  6.  KCl 10 mEq b.i.d.  7.  Valium 2 mg at bedtime.   She is allergic to PENICILLIN.   Operations:  She has had a CABG in 1989. She has had four previous stent  procedures, the most recent of which was done July 15, 2005. She has had  placement of a permanent transvenous pacemaker. She has also had bilateral  total knee replacements.  Medical illnesses include:  1.  Coronary disease.  2.  New onset of atrial fibrillation identified February 6.  3.  Recent identification of profound bradycardia status post pacemaker.  4.  Non-insulin-dependent diabetes.  5.  Hyperlipidemia.  6.  Hypertension.   FAMILY HISTORY:  Noncontributory.   SOCIAL HISTORY:  The patient lives with her son who looks after her,  although apparently during the day she is by herself when her son is at  work. She does not smoke or abuse alcohol or drugs.   REVIEW OF SYSTEMS:  Is somewhat difficult to obtain as the patient is rather  lethargic. HEAD:  She denies headache or dizziness. EYES:  She denies visual  blurring or diplopia. EAR, NOSE AND THROAT:  She denies earache, sinus pain  or sore throat. CHEST:  She states that she is having no chest pain or  shortness of breath. CARDIOVASCULAR:  She denies orthopnea or PND. GI:  She  denies nausea, vomiting, abdominal pain, diarrhea, melena or hematochezia.  GU:  See above. NEURO:  There is no history of seizure or stroke. ENDO:  See  above. Her son says that her blood sugars are generally running in the range  of 150-200.   PHYSICAL EXAMINATION:  GENERAL:  At this point  she is a relatively alert  lady who is oriented x3 and is able to answer simple questions.  VITAL SIGNS:  Her temperature is 101.3, blood pressure at present is 143/70,  heart rate is 110, O2 saturation 99%.  HEENT:  Exam is within normal limits.  CHEST:  Clear.  CARDIOVASCULAR:  Reveals an irregularly irregular heart rhythm consistent  with atrial fibrillation.  ABDOMEN:  Benign. There are normal bowel sounds without masses or  tenderness.  NEUROLOGIC TESTING AND EXAMINATION OF EXTREMITIES:  NORMAL. There is no  cyanosis or edema.   IMPRESSION:  1.  Probable urinary tract infection, possible pyelonephritis.  2.  Atrial fibrillation, chronic, Coumadin therapy.  3.  Coronary disease status post coronary artery bypass grafting, status      post stent procedures x4.  4.  Diabetes.  5.  Hyperlipidemia.  6.  Hypertension.  7.  Obesity.  8.  Status post total knee replacement x2.   PLAN:  Will initiate ciprofloxacin 400 mg IV every 12 hours. Will administer  IV fluids. Follow her blood sugars closely and place her on a sliding scale.  We will continue Coumadin and adjust the Coumadin dosage on the basis of  INR. Will also continue Toprol-XL to regulate her atrial fibrillation. PT,  OT consults and care manager consult will be requested.           ______________________________  Sherin Quarry, MD     SY/MEDQ  D:  08/13/2005  T:  08/13/2005  Job:  [id number]   cc:   Joycelyn Rua, M.D.  Fax: [phone]   Richard A. Alanda Amass, M.D.  Fax: [phone]

## 2010-10-24 NOTE — Op Note (Signed)
[name], [name]               ACCOUNT NO.:  [id number]   MEDICAL RECORD NO.:  [id number]          PATIENT TYPE:  INP   LOCATION:  4705                         FACILITY:  [facility]   PHYSICIAN:  Jackie Priestly, MD  DATE OF BIRTH:  March 15, 1928   DATE OF PROCEDURE:  07/21/2005  DATE OF DISCHARGE:                                 OPERATIVE REPORT   PROCEDURE PERFORMED:  Insertion of a Medtronic EnRhythm [id number] generator,  serial number [id number] with passive ventricular and activation leads.   ATTENDING PHYSICIAN:  Jackie Priestly, MD   COMPLICATIONS:  None.   INDICATIONS FOR PROCEDURE:  [name] is a 77 year old female patient of  [name], M.D. and [name], M.D. with a history of remote  PTCA as well as bypass surgery in 1989.  Her last intervention was in August  of 2003 by Dr. Alanda Lowe.  She was recently admitted on July 14, 2005  with new onset atrial fibrillation, underwent cardiac catheterization by me  on July 15, 2005 revealing midcircumflex lesion which underwent stenting  using a non-D stent.  She was begun on aspirin and Plavix; however, she had  intermittent episodes of 2 to 3 second pauses and now referred for dual  chamber pacer implant.   DESCRIPTION OF PROCEDURE:  After getting informed consent, the patient was  brought to cardiac catheterization lab where her left chest was prepped and  draped in the usual sterile fashion.  ECG monitoring established.  1%  lidocaine was used to anesthetize the left subclavian region.  Next,  approximately 3 cm mid infraclavicular horizontal incision was then carried  out beneath the left clavicle.  Hemostasis obtained with electrocautery.  Blunt dissection was used to carry this down to the left pectoral fascia.  Next, approximately 3 x 4 cm pocket was created over the left pectoral  fascia.  Again hemostasis was obtained.  The left subclavian vein was then  easily entered and a guidewire was  easily passed into the SVC and right  atrium.  Next, #9 [place] dilator and sheath were then tracked over the  guidewire and the guidewire and dilator removed.  Following this, a 52 cm  passive Medtronic lead, model number [id number], serial number [id number] was then  easily passed into the right atrium, guidewires retained, peel-away sheath  removed.  A second 9 French dilator and sheath were then passed over the  retained guidewire and the guidewire and dilator removed. A second Medtronic  45 cm active lead, model number 5076, serial number [id number] was then  passed into the right atrium.  Guidewire was retained and peel-away sheath  was removed.  The guidewire was then fastened to the sheath with a hemostat.  A J-curve was then placed on a ventricular stylet and ventricular lead was  then allowed to cross the tricuspid valve and positioned in the RV apex  without difficulty.  Thresholds then determined.  R waves measured at 30 mV.  Impedance 717 ohms.  Threshold on ventricle 0.3 V at 0.5 msec.  Current 7.6  mA.  10 V negative for diaphragmatic stimulation.  The atrial stylet was  removed and a preformed J was inserted into the atrial lead.  The atrial  lead was then positioned in the neighborhood of the right atrial appendage.  This area was mapped. This patient was noted to be in atrial fibrillation  with rather large P-waves at 2 mV.  The screw was then extended and the  thresholds determined.  P-waves measures at 2.0 mV.  Impedance 561 ohms.  Two silk sutures then used to secure each lead to the pectoral fascia.  The  pocket was then copiously irrigated with 1% kanamycin solution.  The leads  then connected in serial fashion to a Medtronic EnRhythm [id number] generator  serial number [id number] was then delivered to the pocket.  One silk suture  was then used to secure the pectoral fascia to the header.  The subcutaneous  layer was then closed with running 2-0 Vicryl.  The skin then closed  with  running 4-0 Vicryl.  Steri-Strips applied.  The patient was then transferred  to the recovery room in stable condition.   CONCLUSION:  Successful implant of a Medtronic EnRhythm [id number] generator  model number [id number] with passive ventricular and active atrial leads.      Jackie Priestly, MD  Electronically Signed     [name]/MEDQ  D:  07/21/2005  T:  07/21/2005  Job:  [id number]   cc:   [name], M.D.  Fax: [phone]   [name], M.D.  Fax: [phone]

## 2010-10-24 NOTE — Cardiovascular Report (Signed)
NAME:  [name], [name]                         ACCOUNT NO.:  [id number]   MEDICAL RECORD NO.:  [id number]                   PATIENT TYPE:  OIB   LOCATION:  6532                                 FACILITY:  [facility]   PHYSICIAN:  Richard A. Alanda Amass, M.D.          DATE OF BIRTH:  Jan 07, 1928   DATE OF PROCEDURE:  01/10/2002  DATE OF DISCHARGE:                              CARDIAC CATHETERIZATION   PROCEDURES:  1. Retrograde central aortic catheterization  2. Selective coronary angiography.  3. Pre and post intracoronary nitroglycerin administration.  4. Left ventriculogram in RAO and LAO projections.  5. Selective left internal mammary artery.  6. Subselective right internal mammary artery.  7. Right brachiocephalic injection.  8. Abdominal aortic angiogram mid stream PA projection.  9. Percutaneous transluminal coronary angiography and subsequent stent of     high-grade right coronary artery mid stenosis  10.      Percutaneous transluminal coronary angiography and stent of high-     grade proximal-mid right coronary artery stenosis.  11.      Cutting balloon atherectomy of high-grade ostial diagonal 1     stenosis.  12.      Weight-adjusted heparin.  13.      Aggrastat bolus plus infusion.  14.      Plavix 300 mg p.o.   DESCRIPTION OF PROCEDURE:  The patient was brought to the second floor CP  lab in a postabsorptive state as a same-day admission.  Preoperatively  laboratories normal.  Glucophage was on hold.  The BUN and creatinine were  14 and 0.5, respectively.  The patient was premedicated with 5 mg of Valium  p.o.  The right groin was prepped and draped in usual manner.  One percent  Xylocaine was used for local anesthesia.  The RFA was entered with a single  anterior puncture using an 18 thin-wall needle in modified Seldinger  technique.  A 6 French short Diag sidearm sheath was inserted without  difficulty.  Diagnostic coronary angiography was done with a 6 French 4 cm  taper preform coronary and pigtail catheters.  A selective LIMA was done  with the right coronary catheter.  Subselective RIMA was done with the right  coronary catheter.  LV angiogram was done in the RAO and LAO projection at  25 cc and 14 cc/sec and 20 cc and 12 cc/secondary, respectively.  Pullback  pressure of the CA was performed showing no gradient across the aortic  valve.  During diagnostic coronary angiography, intracoronary nitroglycerin  was administered at 0.2 mg at a time with repeat injections obtained.  The  patient was given 2 mg of Nubain for sedation during the diagnostic  procedure.  During the remainder of the procedure, she was given another 2  mg of Nubain and 2 mg of Versed in divided doses for sedation.  Omnipaque  dye was used throughout the procedure.  The patient tolerated the diagnostic  procedure well.  Pressures:  Left ventricle 200/0.  LVEDP 18 mmHg.  A 24 mmHg.  CA 200/87  mmHg.   There was no gradient across the aortic valve on catheter pullback.   Fluoroscopy showed a prior coronary stent in the mid circumflex artery.  There was 2+ coronary calcification in the right LAD system.  There was no  significant intracardiac or valvular calcification seen.   LV angiogram demonstrated mild hypokinesis in a focal area of the mid  anterolateral wall.  Otherwise there was good contraction of the LV.  Angiographic LVH was present and the EF was greater than 60%.  There was no  significant mitral regurgitation.   The right brachiocephalic was widely patent.  The RIMA was a ungrafted  vessel and intact.  The right vertebral was antegrade.   The left subclavian had no significant stenosis.  The LIMA arose just after  the bend in the subclavian and antegrade left vertebral.  There was a large  internal thoracic branch from the proximal portion of the LAD.   There was competitive flow through the LIMA.  There was a good anastomosis  to the LAD.  There was  competitive flow between the native vessel and the  LIMA.  No significant thick stenosis was seen and the LAD was visualized  down to the apex of the heart where it bifurcated without significant distal  disease.   The main left coronary artery was short and normal.   There was very mild 20% narrowing of the ostial LAD in some views, but this  was felt to be minor with normal flow.   The LAD had irregularity at 40% narrowing after the large first diagonal and  between the two septal perforator branches.  The remainder of the LAD had  irregularity, but no high-grade stenosis and did fill antegrade, although  there was competitive filling of the LAD through the previously placed LIMA  where the insertion was seen down to the apex.   The first diagonal branch had a calcific eccentric 90-95% stenosis in its  ostial portion.  It was a moderate size branch that bifurcated distally.   The circumflex artery gave off a moderately large first marginal branch that  was normal.  Beyond this, there was 50-60% smooth narrowing of the  circumflex.  In the mid circumflex beyond the PAVP branch, there was less  than 10-20% narrowing of the previously placed PS15-30 stent (May 10, 1995).  There was TIMI-3 flow to the distal circumflex that bifurcated twice  and was a large distal vessel.   The right coronary was a dominant vessel.  The ostium was eccentrically  located from the aorta and initially was not selectively catheterized with  diagnostic catheter for this reason.  There did not appear to be any  significant ostial stenosis on subselective or selective injections and  there was no damping on subsequent selective injections.  There was a conus  branch that arose from the very proximal RCA.   There was a calcified, complex, 85% stenosis of the proximal RCA at the  junction of the proximal and mid portion before the large RV branch.  This  was mildly segmental.  There was a segmental 75%  stenosis that was concentric of the mid LAD beyond  the second RV branch before the acute margin.  The distal RCA was comprised  of a bifurcating PLA and a PDA of moderate size, but had no significant  stenosis.   Abdominal aortic angiogram in  the mid PA projection showed 30-40% narrowing  of the right renal artery, no significant left renal artery stenosis, and  moderate intrarenal atherosclerotic disease.  The celiac SMA and IMAs were  intact.  There were moderate irregularities of the right iliac system with  no high-grade stenosis and hypogastrics were intact bilaterally.   DISCUSSION:  This 77 year old, widowed, white lady has a history of known  coronary artery disease.  She had single-vessel CABG in 1989 by Ines Bloomer, M.D., after angiography poor result from LAD PTCA and threatened  occlusion.  She  had LIMA to the LAD placed at that time.  She subsequently  had stenting of the mid circumflex on May 10, 1995, with a PS15-30 stent  for progression of disease and high-grade stenosis done on research protocol  with associated risk factors of hyperlipidemia, exogenous obesity, and  hypertension.  She has had prior catheterization in July of 1997, which  demonstrated a widely patent circumflex stent, competitive flow through the  LIMA to the LAD with no high-grade LAD disease and only minor disease of the  right coronary artery.  She was treated medically at that time.   She recently had chest pain compatible with ischemia.  An outpatient  Cardiolite was performed, demonstrating ischemia in the inferoapical and  anterolateral walls with normal EF.  This ischemia was new compared with the  prior Cardiolite of May 09, 2001.  For this reason, she is undergo  angiography.   The patient has no significant LAD disease.  She does have competitive flow  with her previously placed LIMA, but no high-grade LAD disease.  She does  have a high-grade ostial diagonal lesion.   In  addition, there is a high-grade proximal RCA lesion and a potentially  flow-limiting mid RCA lesion that probably accounts for inferoapical  ischemia and recent angina.  The lesion in her circumflex proximal to her  previously placed mid circumflex stent appears to be of borderline  significance and does not appear flow limiting angiographically at about 50-  60% estimated narrowing at present.   It was felt best to proceed with intervention at this time.  Informed  consent was obtained from the patient.   She was given weight-adjusted heparin, Aggrastat bolus plus infusion, and a  total of 300 mg of Plavix in divided doses.  We initially tried to intubate  the right coronary with a 6 [place] guiding catheter, but this was not  possibly to selectively engage this.  This was anticipated because of  difficulty with diagnostic catheter intubation with her somewhat dilated  aorta presumably from her hypertension.  She was also started on intravenous nitroglycerin and intracoronary nitroglycerin was administered.  The right  coronary was intubated with an AL1 6 [place] Sci-Med Guidant catheter.  This  was carefully manipulated to the ostium of the right coronary artery and  watched carefully throughout the procedure to prevent any injury or  dissection to the RCA.  There was no significant damping with the 6 French  catheter.  An HGF 0.014 inch wire was used to cross the distal RCA across  both lesions.  The RCA lesion beyond the second RV branch was predilated  with a 3.0/15 [place] balloon at 5/41.  The proximal lesion before the RV  branch was dilated 6/43.  Scout injection showed elastic recoil with  residual narrowing and it was elected to proceed with stenting.  Because of  the large of the right coronary artery,  it was felt that standard stents  could be used in this setting rather than DES.  Also, appropriate DES stents  were not available in the laboratory at this time.   An  ACS Zeta balloon expandable 3.5 x 13 stent was then positioned across the  stenosis beyond the second RV branch and deployed at 10-34 post dilator at  12-37.   The proximal lesion after scout injection was then approached and this was  stented with a 3.5 x 12 stent deployed at 12-40 post dilator 12-18.  Final  injections demonstrated the mid RCA lesion beyond the second RV branch to be  reduced from 75% to -10% with no dissection and good flow.   The proximal-mid RCA stenosis, 85%, reduced to -10%.  There was residual 30-  40% narrowing, which appeared less in the RAO projection proximal to the  proximal stent.  This did not appear to be flow limiting.  There was no  dissection.  This was not intervened on.   We then approached the diagonal.  The dilatation system from the right was  removed and the left coronary was intubated with a JL3.5 Guidant catheter.  HDF wire was used to cross into the moderate size VX1 across the ostial  stenosis.  A 2.5/6 mm cutting balloon was then used across the ostial at 6  atmospheres for 60 seconds.  This was then withdrawn.  Scout injections  showed good angiographic results.  Mild haziness, but less than 30%  narrowing and good flow throughout the first diagonal.  Because of the  takeoff of this diagonal and the size and the good angiographic result after  cutting balloon of the ostial stenosis, it was not felt that stenting was  necessary.   We took another picture of the circumflex lesion and elected not to  intervene on this with about 50-60% smooth narrowing and good residual  lumen.  The dilatation system was removed.  The final ACT was 220 seconds.  The patient was transferred to the holding area for further ACT measurement,  sheath removal, and pressure hemostasis.  She tolerated the procedure well.   The patient has had successful stenting of her proximal-mid RCA lesions.  She has had successful cutting balloon atherectomy of a high-grade  ostial DX1 stenosis.  We did not intervene on a 50-60% smooth circumflex with the  cutting balloon, circumflex narrowing beyond the previously placed patent  mid stent, or the mild 30-40% eccentric disease proximal to the RCA.  She is  at risk of restenosis of the stents.  Hopefully this will be small because  of the good size of the stents and/or progression of disease in her DX1 or  circumflex or other native vessels.  She has competitive flow in the LAD  with high-grade LAD stenosis.  Continued modification of risk factors and  medical therapy is recommended.   CATHETERIZATION DIAGNOSES:  1. Atherosclerotic heart disease.  Positive recent onset angina with     positive Cardiolite for ischemia to distribution on December 19, 2001.  2. Prior left anterior descending percutaneous transluminal coronary     angiography in 1989.  3. Single-vessel left internal mammary artery to left anterior descending in     1989.  4. Successful stent of high-grade mid circumflex native stenosis on May 10, 1995.  No restenosis on this study.  5. Bidirectional flow, left internal mammary artery to left anterior     descending with no high-grade native left  anterior descending disease.  6. Systemic hypertension.  7. Left ventricular hypertrophy with good left ventricular function.  8. Adult onset diabetes mellitus.  9. Intractable obesity.  10.      Arthritis, status post total knee replacements bilaterally.  11.      History of prior depression.  12.      Successful __________ stenting of proximal-mid and mid right     coronary artery.  13.      Successful ostial cutting balloon atherectomy of first diagonal.                                               Richard A. Alanda Amass, M.D.    RAW/MEDQ  D:  01/10/2002  T:  01/14/2002  Job:  [id number]   cc:   CC Lab   Tammy R. Collins Scotland, M.D.   Madaline Savage, M.D.

## 2010-10-24 NOTE — Discharge Summary (Signed)
[name], [name]               ACCOUNT NO.:  [id number]   MEDICAL RECORD NO.:  [id number]          PATIENT TYPE:  INP   LOCATION:  5502                         FACILITY:  [facility]   PHYSICIAN:  [name], M.D.DATE OF BIRTH:  April 06, 1928   DATE OF ADMISSION:  10/21/2005  DATE OF DISCHARGE:  11/03/2005                                 DISCHARGE SUMMARY   DISCHARGE DIAGNOSES:  1.  Methicillin-sensitive Staphylococcus aureus infected left knee      arthroplasty - status post surgery.  2.  Diabetes mellitus.  3.  Escherichia coli urinary tract infection.  4.  Renal insufficiency - resolved.  BUN and creatinine at the time of this      dictation 12 and 0.9.  5.  History of atrial fibrillation.  6.  History of coronary artery disease - status post coronary artery bypass      graft.  7.  History of hypertension.  8.  History of hyperlipidemia.   PROCEDURES:  1.  Open arthrotomy and thorough debridement and synovectomy of left total      knee.  Revision of the polyethylene liner of the left total knee.  2.  PICC line placement - left arm, per radiology.  3.  X-ray of knee - anatomic alignment of left total knee arthroplasty.  No      evidence of loosening or granulomatosis.  Small to moderate-size joint      effusion.   HISTORY:  The patient is a 77 year old white female with past medical  history significant for atrial fibrillation, coronary artery disease -  status post CABG in 1989, diabetes mellitus, and obesity, who was last  hospitalized about 2 months prior to this admission for UTI and bacteremia  secondary to an infected pacer.  The patient reported that since that  discharge she had been feeling progressively weak.  She came to the ER and  was noted to have a normal white cell count but was noted to have a moderate  UTI.  Other labs revealed a creatinine of 1.3, with a normal BUN, and the  patient had a hemoglobin of 10.9 and a hematocrit of 32, and these were the  same as previously.  The patient complained of knee pain that had been going  on for the week prior to her admission.  She had an outpatient x-ray and was  told that it did not show any dislocation.  The patient denied headaches,  vision changes, dysphagia, chest pain, palpitations, bloody stools, dysuria,  abdominal pain, constipation, melena, and no changes in her bowel habits.  She was admitted to the [facility] service for further evaluation and  management.   PHYSICAL EXAMINATION ON ADMISSION:  As per Dr. Rito Lowe:  VITAL SIGNS:  Temperature 99.7, pulse 66, blood pressure 122/66, respiratory  rate 20, O2 saturations 98%.  GENERAL:  Alert and oriented x3.  A strong urine odor was noted.  HEENT:  She had slightly dry mucous membranes.  CARDIOVASCULAR:  Irregular rhythm, but controlled rate.   The rest of her physical exam was noted to be within normal limits.  LABORATORY DATA:  Urinalysis showed 5 ketones, small hemoglobin, large  leukocyte esterase, nitrites positive, with WBCs too numerous to count and  bacteria present.  Sodium 138, potassium 4, chloride 106, bicarbonate 25,  BUN 19, creatinine 1.3, glucose 104.   HOSPITAL COURSE:  1.  Methicillin-sensitive Staphylococcus aureus infected left knee.  Upon      admission, orthopedics was consulted, and the patient was started on      empiric vancomycin.  Orthopedics did surgical debridement, as stated      above, of that knee, and cultures were sent.  The cultures grew      methicillin-sensitive Staphylococcus aureus, and the sensitivities      reported that it is sensitive to clindamycin, resistant to penicillin.      The patient has been changed to clindamycin 300 mg IV q.12 h., and the      total duration of IV antibiotics will be indicated at the time of      discharge.  2.  Escherichia coli urinary tract infection.  The patient was appropriately      placed on Cipro upon admission.  The urine cultures grew  Escherichia      coli sensitive to the Cipro, and the patient has received a full      treatment course of antibiotics for the UTI and will not require any      further antibiotics for this at this time.  3.  Diabetes mellitus.  The patient was maintained on Glucotrol and      Glucophage during her hospital stay.  She was also covered with sliding      scale insulin and is to continue this upon discharge.  4.  History of anemia.  Her H&H remained stable during her hospital stay.      Her last hemoglobin prior to this dictation is 11.3 and 34.1, and she      was maintained on iron supplements during her hospital stay.  5.  Atrial fibrillation.  The patient was restarted on anticoagulation      following her surgery.  Her INR today at the time of this dictation is      1.9.  Her Coumadin dose will be indicated at the time of discharge.  6.  Renal insufficiency.  Resolved.  Her BUN and creatinine at the time of      this dictation are 12 and 0.9.  7.  History of coronary artery disease, status post CABG.  Stable.  Patient      remained chest pain free throughout her hospital stay.  8.  History of hyperlipidemia.  9.  History of hypertension.   MEDICATIONS AT THE TIME OF THIS DICTATION:  1.  Clindamycin 300 mg IV q.6 h.  2.  Lasix 40 mg p.o. b.i.d.  3.  Nu-Iron 150 mg p.o. b.i.d.  4.  Glucotrol 5 mg p.o. b.i.d.  5.  Insulin sliding scale NovoLog.  6.  Diovan 160 mg daily.  7.  Metformin 1 g p.o. b.i.d.  8.  Plavix 75 mg p.o. daily.  9.  Fosamax 70 mg q.week.  10. Lovastatin 40 mg p.o. daily.  11. Albuterol MDI 2 puffs q.4-6 h. p.r.n.  12. KCl 20 mEq p.o. b.i.d.  13. Valium 2 mg p.o. at bedtime, hold for sedation.  14. Toprol 25 mg p.o. daily.  15. The patient's Coumadin is to be indicated at the time of discharge as      well as the duration of IV antibiotics.  16. Lortab 1-2 tablets p.o. q.4-6 h. p.r.n.  17. MiraLax 17 g p.o. daily, hold for diarrhea.  The patient's final  discharge medications as well as follow-up care are to  be indicated at the time of her discharge.      [name], M.D.  Electronically Signed     ACV/MEDQ  D:  11/02/2005  T:  11/02/2005  Job:  [id number]   cc:   [name], M.D.  Fax: [phone]

## 2010-10-24 NOTE — Discharge Summary (Signed)
[name], [name]               ACCOUNT NO.:  [id number]   MEDICAL RECORD NO.:  [id number]          PATIENT TYPE:  INP   LOCATION:  4705                         FACILITY:  [facility]   PHYSICIAN:  Richard A. Alanda Amass, M.D.DATE OF BIRTH:  April 20, 1928   DATE OF ADMISSION:  07/14/2005  DATE OF DISCHARGE:  07/23/2005                                 DISCHARGE SUMMARY   ADMISSION DIAGNOSES:  1.  New onset of atrial fibrillation with chest pain, rule out coronary      artery disease progression.  2.  Coronary artery bypass grafting in 1989 with prior intervention to the      circumflex, December 1962, two-site right coronary artery percutaneous      coronary intervention and high-speed rotational atherectomy of first      diagonal, August 2003.  3.  Preserved left ventricular function.  4.  Non-insulin-dependent diabetes.  5.  Hyperlipidemia.  6.  Hypertension.  7.  Obesity.   DISCHARGE DIAGNOSES:  1.  Coronary artery disease, status post coronary artery bypass graft in      1989 with new atrial fibrillation and left circumflex stenosis, 70%,      requiring percutaneous coronary intervention.  2.  New-onset atrial fibrillation.  3.  Profound bradycardia with 3-second pauses.  4.  Non-insulin-dependent diabetes mellitus.  5.  Hyperlipidemia.  6.  Hypertension.  7.  Mild aortic stenosis.  8.  Body mass index of 34.6.   PROCEDURES:  1.  Cardiac catheterization, percutaneous coronary intervention and stenting      of the left circumflex with a bare-metal stent; that was on July 15, 2005.  2.  Placement of permanent transvenous pacemaker, [employer] EnRhythm      [id number], serial number [id number], with passive ventricular activation      leads.   BRIEF HISTORY:  The patient is a 77 year old female who was seen by primary  care, Dr. Alanson Aly. Lenise Arena, and was found to have an EKG showing new atrial  fibrillation.  She is status post CABG in 1989 with circumflex stenting in  December of 1996 and two-site RCA intervention and diagonal intervention by  Dr. Alanda Amass in August 2003.  Cardiolite study, August 2005, was negative  for ischemia and showed good LV function.  Atrial fibrillation has not been  a problem in the past.  She notes that 1 week ago she developed nausea and  vomiting followed by chest pain.  She has had intermittent chest pain on and  off for the past week, along with a cough.  She was concerned she had  pneumonia and went to her family doctor.  EKG showed atrial fibrillation  with a ventricular response of about 112 beats per minute.  The patient was  subsequently admitted for IV heparin and diagnostic catheterization prior to  Coumadin therapy.   MEDICATIONS ON ADMISSION:  1.  Toprol-XL 25 mg in the morning and 25 mg in the evening.  2.  Fosamax weekly.  3.  Diovan 160 mg daily.  4.  Aspirin 81 mg daily.  5.  Metformin 1 g  twice daily a.c.  6.  Glipizide 10 mg daily.  7.  Lovastatin 40 mg daily.  8.  Albuterol 4 mg p.o. daily.  9.  Lasix 40 mg daily.  10. Potassium 20 mEq daily.  11. Relafen 500 mg nightly.  12. Diazepam 10 mg nightly.   ALLERGIES:  PENICILLIN.   HISTORY OF PRESENT ILLNESS:  For further history and physical, please see  the dictated note.   HOSPITAL COURSE:  The patient was admitted and underwent cardiac  catheterization.  This showed a 40% proximal and a 40% distal RCA stenosis  before the bifurcation.  She had a small 70% stenosis in the PD, which was a  small artery.  The LAD showed a 40%, a 50% and a 60% stenosis prior to the  insertion of the LIMA, which was functioning well.  There was a 70% OM  lesion and a 70% mid left circumflex lesion.  EF was 50%.  After completion  of the study and PCI, the patient was sent back to the floor.  The patient  was subsequently found to be bradycardic with 3- and 6-second pauses.  The  patient was essentially asymptomatic with these at the time.  It was  ultimately  determined that she should undergo placement of a permanent  transvenous pacemaker, which was done on May 20, 2006, as described  above.  The patient tolerated this well.  She was restarted back on her  Coumadin on May 20, 2006 and it was Dr. Fredirick Maudlin opinion she could be  discharged home on Coumadin; he discussed this with Dr. Jenne Campus, who wants  her to be on Coumadin, therapeutic, Plavix 75 mg one daily and we are going  to place her on aspirin 81 mg daily for 7 days and then discontinue it as  her Coumadin becomes therapeutic.  We are going to have a home health nurse  go out and draw her pro times, monitor her medications and her overall  condition.  We will have her come back in 1 week for a wound check for the  pacer site and we will have her come back in 2 weeks to see either Dr.  Jenne Campus or Dr. Alanda Amass.  We will get a pro time on the patient next Monday  and set her up for a home health nurse to assist with this care.  She has  been ambulated; she walked 200 feet today, heart rate was 73, blood pressure  was 120/70 and overall the patient is generally doing well.   Chest site looks good.  Cath site looks good.  The patient is having no  further chest discomfort.   LABORATORY DATA:  Today, the hemoglobin was 11.7, hematocrit was 34, white  count was 8.3 and platelets are 275,000.  Electrolytes are normal with a  sodium of 139, potassium of 3.6, chloride of 106, CO2 of 27.  BUN is 21,  creatinine is 1.1 and glucose is 182.   DISCHARGE MEDICATIONS:  The patient will go home on:  1.  Plavix 75 mg one daily.  2.  Zantac 300 mg daily.  3.  Coumadin 5 mg daily or as directed for an INR between 2.0 and 2.9.   Additional medications include the home:  1.  Fosamax 70 mg weekly.  2.  Toprol-XL 25 mg twice daily.  3.  Diovan 160 mg daily.  4.  Aspirin 81 mg daily for 7 days and then is to be discontinued.  She is going to  restart her:  1.  Metformin 1 g p.o. twice daily.   2.  Glipizide 10 mg daily.  3.  Lovastatin 40 mg daily.  4.  Albuterol 4 mg p.r.n.  5.  Furosemide 40 mg daily.  6.  Kay Ciel 10 mEq twice daily.  7.  __________  500 mg daily.  8.  Diazepam 10 mg at bedtime daily.   CONDITION ON DISCHARGE:  Improved.      Eber Hong, P.A.      Richard A. Alanda Amass, M.D.  Electronically Signed    WDJ/MEDQ  D:  07/22/2005  T:  07/23/2005  Job:  [id number]   cc:   Joycelyn Rua, M.D.  Fax: [phone]

## 2010-10-24 NOTE — Op Note (Signed)
[name], [name]               ACCOUNT NO.:  [id number]   MEDICAL RECORD NO.:  [id number]          PATIENT TYPE:  INP   LOCATION:  5502                         FACILITY:  [facility]   PHYSICIAN:  Georges Lynch. Gioffre, M.D.DATE OF BIRTH:  08-28-1927   DATE OF PROCEDURE:  10/26/2005  DATE OF DISCHARGE:                                 OPERATIVE REPORT   PREOPERATIVE DIAGNOSIS:  Infected left total knee arthroplasty.   POSTOPERATIVE DIAGNOSIS:  Infected left total knee arthroplasty.   PROCEDURE:  1.  Open arthrotomy and thorough debridement and synovectomy of the left      total knee.  2.  Revision of the polyethylene liner of the left total knee.   INDICATIONS FOR PROCEDURE:  She had a total knee arthroplasty performed by  me 12 years ago.  She developed a septicemia secondary to an infected  pacemaker and a urinary tract infection.   SURGEON:  Georges Lynch. Darrelyn Hillock, M.D.   ASSISTANT:  Jamelle Rushing, P.A.   DESCRIPTION OF PROCEDURE:  Under general anesthesia, routine orthopedic prep  and draping of the left lower extremity was carried out.  The leg was  exsanguinated with an Esmarch.  The tourniquet was elevated at 375 mmHg.  At  this time, I did an initial prep followed by a sterile prep of her left  knee.   An incision was made over the anterior aspect of the left knee through the  old incision site.  Bleeders were identified and cauterized.  Two flaps were  created and reflected.  I then carried out a median parapatellar arthrotomy  and reflected the patella laterally, flexed the knee, and did a thorough  synovectomy and debridement of her knee.  Cultures were taken for aerobic  and anaerobic.  We also sent tissue down for tissue cultures.  Preoperatively, she did culture out Staphylococcus aureus.  After doing a  thorough debridement, I then removed the tibial insert.  Note, we had  preplanning on this.  We had to send off for the replacement polyethylene  liner since this was an  old style prosthesis.  We thoroughly debrided the  area, irrigated the area with the pulse lavage and then with the antibiotic  solution.  We then removed the fluid and then inserted our permanent series  2, 10-mm thickness, size 7 insert.  The wound then was thoroughly irrigated  again and closed over a Hemovac drain.  Note, there was no gross purulent  material present.  There was some fluid present obviously when we opened the  knee, but she had more of a granulation-type tissue present.   For her postoperative care, she will be Coumadin and heparin  postoperatively.  She will be on vancomycin postoperatively, and she will be  managed for her diabetes.           ______________________________  Georges Lynch Darrelyn Hillock, M.D.     RAG/MEDQ  D:  10/26/2005  T:  10/27/2005  Job:  [id number]   cc:   Sherin Quarry, MD

## 2010-10-24 NOTE — Discharge Summary (Signed)
NAME:  [name], [name]                         ACCOUNT NO.:  [id number]   MEDICAL RECORD NO.:  [id number]                   PATIENT TYPE:  INP   LOCATION:  5740                                 FACILITY:  [facility]   PHYSICIAN:  Corinna L. Lendell Caprice, MD             DATE OF BIRTH:  May 01, 1928   DATE OF ADMISSION:  08/27/2003  DATE OF DISCHARGE:  08/29/2003                                 DISCHARGE SUMMARY   DIAGNOSES:  1. Influenza A.  2. Weakness.  3. Status post fall.  4. Dehydration.  5. Type 2 diabetes.  6. Hypertension.  7. Coronary artery disease.  8. Hyperlipidemia.  9. Mild bronchospasm.  10.      Hypokalemia.   DISCHARGE MEDICATIONS:  1. Tamiflu 75 mg p.o. b.i.d. for 4 more days.  2. Tylenol as needed for pain or fever.  3. Albuterol inhaler 2 puffs q.4h. as needed for shortness of breath or     wheezing.  4. She may continue her outpatient medications, which include Fosamax     weekly, Toprol XL 50 mg p.o. q.d., potassium supplementation t.i.d.,     Zocor 40 mg p.o. q.d., Votrex daily, Lasix 40 mg p.o. b.i.d., Relafen 500     mg p.o. q.d., Amaryl 4 mg p.o. q.d., aspirin 81 mg per day, Plavix 75 mg     p.o. q.d., Glucophage.   Follow up with Dr. Dayton Scrape as needed.   ACTIVITY:  No heavy exertion for a week.   Diet should be diabetic with adequate fluid intake.   CONDITION AT DISCHARGE:  Stable.   PERTINENT LABORATORY:  Blood cultures negative to date.  Basic metabolic  panel on admission was significant for a potassium of 3.3, glucose of 197.  CBC unremarkable.  Total CPK 140.  TSH normal.  UA negative for nitrites or  leukocyte esterase.  Rapid flu swab positive for influenza A.   SPECIAL STUDIES AND RADIOLOGY:  Chest x-ray showed nothing acute.   HISTORY AND HOSPITAL COURSE:  [name] is a very pleasant, 77 year old  white female who lives with her grandson.  She fell while he was gone.  She  called a neighbor, and she had been unable to get up off the floor  for 30  minutes.  She had been having fevers, chills, cough, myalgias.  Her appetite  had been poor.  In the emergency room, she had a temperature of 103.4 and  was quite ill appearing.  She appeared dehydrated and was generally weak.  The flu swab was positive.  She initially was given a dose of IV Avelox in  the emergency room, but this was not continued, as she had no evidence of  bacterial infection.  She was admitted to the floor, given her severe  weakness, and given IV fluids.  She was started on Tamiflu, as the onset had  been within 48 hours.  Her Lasix was held.  The  next day, patient was  feeling much better.  She was afebrile, eating, able to ambulate without  difficulty.  She did have mild wheezing and was given an inhaler, a  prescription for albuterol to fill if her shortness of breath returned.  She  was having a lot of myalgias, which is related to the flu.  Her CPK was not  elevated, thus ruling out rhabdomyolysis.  She is being discharged in stable  condition.                                                Corinna L. Lendell Caprice, MD    CLS/MEDQ  D:  08/29/2003  T:  08/30/2003  Job:  [id number]   cc:   Gerlene Burdock A. Alanda Amass, M.D.  [phone] [address]  [place]  [place] [address]  Fax: [phone]   Meredith Staggers, M.D.  [address], [address]  [place]  [place] [address]  Fax: [phone]

## 2010-10-24 NOTE — H&P (Signed)
NAMESABENA, WINNER               ACCOUNT NO.:  [id number]   MEDICAL RECORD NO.:  [id number]          PATIENT TYPE:  INP   LOCATION:  1408                         FACILITY:  [facility]   PHYSICIAN:  Georges Lynch. Gioffre, M.D.DATE OF BIRTH:  March 08, 1928   DATE OF ADMISSION:  01/12/2006  DATE OF DISCHARGE:                                HISTORY & PHYSICAL   CHIEF COMPLAINT:  Severe pain left total knee arthroplasty.   HISTORY OF PRESENT ILLNESS:  The patient is a 77 year old female familiar to  our practice who came in the date of admission to our office for evaluation  of severe pain in her left total knee arthroplasty.  The patient does have a  history in the recent past of having had an infected pacemaker with  secondary bacteremia, a urinary tract infection, and a left total knee  arthroplasty infection.  The total knee arthroplasty infection was diagnosed  and treated in mid May with surgical debridement of the capsule with  polyethylene exchange.  The patient was initially placed on IV vancomycin  and then was able to transition over to IV clindamycin as an outpatient up  until June 26.  The patient's cultures indicated an methicillin-sensitive  Staphylococcus aureus.  The patient's last IV antibiotics was around June  26.  The patient indicates about 1 week previous from this date that the  patient started to develop recurrent severe knee pain progressively since  that time.  She did note some swelling.  She denies any specific fevers or  chills and she is in the office today for evaluation of such.  After  multiple clean alcohol prepping of the knee and Betadine prepping, I did  stick an 18-gauge needle into the knee and pulled out significantly purulent  material from the internal joint capsule and this was sent to [facility]  for aerobic/anaerobic cell counts and Gram stain.  The patient then had  arrangements made for admission into the hospital for IV antibiotics and  surgical  removal of her total knee arthroplasty with an antibiotic-  impregnated spacer and outpatient IV antibiotics for an estimated 6-8 weeks  for consideration of reimplantation of a new total knee.  This is  anticipated to be done on August 14, Tuesday evening, by Dr. Charlann Boxer.   PAST MEDICAL HISTORY:  1.  Recent infected pacemaker with subsequent removal and secondary      bacteremia.  2.  Recent UTI.  3.  Recent left total knee arthroplasty infection with subsequent arthrotomy      and debridement and polyethylene exchange and IV antibiotics.  4.  History of atrial fibrillation, chronic Coumadin use.  5.  Coronary artery disease status post CABG.  6.  Poorly-controlled diabetes.  7.  Hypertension.  8.  Hyperlipidemia.   ALLERGIES:  1.  PENICILLIN.  2.  The patient indicates that CIPRO causes significant nausea and vomiting.   MEDICATIONS ON ADMISSION:  1.  Lasix 40 mg a day.  2.  K-Dur 20 mEq p.o. t.i.d.  3.  Diovan 160 mg a day.  4.  Glipizide 10 mg a day.  5.  Metformin 1000 mg q.a.m.  6.  Lovastatin 80 mg a day.  7.  Diazepam 2 mg q.h.s.  8.  VoSpire 4 mg p.m.  9.  Aspirin 81 mg a day.  10. Coumadin 2.5 on Tuesdays, Thursdays; 3 mg on Mondays, Wednesdays,      Saturdays, and Sundays.   SOCIAL HISTORY:  The patient is a widow.  She lives with her grandson.  No  history of tobacco or alcohol use.   PHYSICAL EXAMINATION:  VITAL SIGNS:  Blood pressure 160/78, heart rate of  90, respirations 20, temperature is 97.5, saturation is 98 on room air.  GENERAL:  This is an elderly-appearing, well-developed, non-septic-appearing  female in a wheelchair due to severe pain in her left total knee  arthroplasty.  HEENT:  Head is normocephalic.  Pupils equal and reactive.  Extraocular  movements intact.  Oral buccal mucosa is pink and moist.  NECK:  Good range of motion without any difficulty.  HEART:  Slightly irregular, fairy regular rate.  No murmurs, rubs, or  gallops.  CHEST:  Lung  sounds were clear and equal bilaterally.  ABDOMEN:  Soft and nontender.  EXTREMITIES:  The patient had good range of motion of her upper extremities  without any difficulty or weakness.  LOWER EXTREMITIES:  Left knee had a well-healed midline surgical incision,  absolutely no signs of infection, no erythema.  It is only slightly warmer  than the right knee.  She does have a palpable effusion in that knee and she  has pain with any attempts at range of motion.  Her right knee is normal  range of motion without difficulty.  The lower extremities are otherwise  symmetrically sized and shaped.  PERIPHERAL VASCULAR:  Carotid pulses were 2+, no bruits.  Radial pulses were  2+.  Dorsalis pedis pulses were 1+.  She had some slight lower extremity  edema and varicosities.  NEUROLOGIC:  The patient was conscious, alert and appropriate, good  historian.  She had no gross neurologic defects noted.   LABORATORY DATA ON ADMISSION:  Found hemoglobin at 10.2, white count 6.3.  Routine chemistries were within normal limits except for glucose at 200.  INR was 1.1.   IMPRESSION:  1.  Recurrent infection left total knee arthroplasty with cultures pending.  2.  History of recent pacemaker infection with removal with bacteremia.  3.  History of atrial fibrillation on Coumadin therapy.  4.  Poorly-controlled diabetes.  5.  History of coronary artery disease and coronary artery bypass graft      surgery.  6.  Hypertension.  7.  Hyperlipidemia.   PLAN:  At this time, Ms. Vigna will be admitted to [facility] to  start IV antibiotic treatment with vancomycin due to this recurrent  infection.  The patient will have p.r.n. aspiration of the knee for comfort  issues  until Dr. Charlann Boxer is able to do a debridement of the left total knee  arthroplasty with hardware removal and antibiotic spacer implantation on the following Tuesday.  We will do routine preoperative evaluations at this  time.  We will  also have her primary care physician contacted for evaluation  and management of her medical issues.      Jamelle Rushing, P.A.    ______________________________  Georges Lynch Darrelyn Hillock, M.D.    RWK/MEDQ  D:  01/13/2006  T:  01/13/2006  Job:  [id number]

## 2010-10-24 NOTE — H&P (Signed)
[name], [name]               ACCOUNT NO.:  [id number]   MEDICAL RECORD NO.:  [id number]           PATIENT TYPE:   LOCATION:                                 FACILITY:   PHYSICIAN:  Madlyn Frankel. Charlann Boxer, M.D.  DATE OF BIRTH:  09/11/1927   DATE OF ADMISSION:  DATE OF DISCHARGE:                                HISTORY & PHYSICAL   CHIEF COMPLAINT:  Status post removal of implant of left total knee  replacement.   HISTORY OF PRESENT ILLNESS:  [name] is a 77 year old female who is 8  weeks status post removal of implant of her left total knee with the  insertion of an antibiotic impregnated spacer.  The antibiotics were  discontinued at the end of September.  She states that since the removal,  she has had no new onset of any fevers, chills, muscle aches or pains.   PAST MEDICAL HISTORY:  1. Status post left total knee arthroplasty infection with subsequent      arthrotomy and debridement and polyethylene exchange and IV      antibiotics.  2. Recent infected pacemaker with subsequent removal and secondary      bacteremia.  3. History of atrial fibrillation and chronic Coumadin use.  4. Coronary artery disease, status post CABG.  5. Diabetes.  6. Hypertension.  7. Hyperlipidemia.   ALLERGIES:  1. PENICILLIN.  2. CIPRO.   MEDICATIONS:  Include Lasix, K-Dur, Diovan, glipizide, metformin,  Lovastatin, diazepam, BuSpar, aspirin 81, Coumadin 2.5 on Tuesdays and  Thursday, 3 mg on Mondays, Wednesdays, Saturdays and Sundays.   REVIEW OF SYSTEMS:  In the past 2 weeks, the patient states that she has had  no new chest pain, shortness of breath, abdominal pain, diarrhea,  constipation, headache, syncope, changes in vision or new onset of  musculoskeletal joint pain other than those noted in history of present  physical illness.   PHYSICAL EXAMINATION:  VITAL SIGNS:  Temperature 98.7.  Pulse 88.  Respirations 18.  Blood pressure 110/56.  GENERAL:  Patient is awake, alert and  oriented, well-developed, well-  nourished, appeared to be in no acute distress.  NECK:  Supple.  No  lymphadenopathy.  No carotid bruits noted.  CHEST:  The lungs were clear to auscultation bilaterally.  BREASTS:  Deferred.  HEART:  Irregular rate and rhythm. No murmurs, rubs or gallops.  ABDOMEN:  Soft, nontender, nondistended.  Bowel sounds are present in all 4  quadrants.  GENITOURINARY:  Deferred.  EXTREMITIES:  Left knee has a healed mid incision.  There is no erythema or  signs of infection.  SKIN:  Shows no new rashes or skin breakdown left lower extremity.  NEUROLOGIC:  Distal sensibilities intact.   LABS:  Are pending.  X-rays, none taken at this time.   IMPRESSION:  Status post removal of an implanted left total knee  arthroplasty with a cement spacer impregnated with antibiotics.   PLAN OF ACTION:  Revision of left total knee arthroplasty to be performed by  surgeon, Dr. Durene Romans, on March 23, 2006.  All risks and complications  were discussed  to the satisfaction of the patient.  All questions were  encouraged, answered and reviewed.     ______________________________  Yetta Glassman [address], [place]      Madlyn Frankel. Charlann Boxer, M.D.  Electronically Signed    BLM/MEDQ  D:  03/22/2006  T:  03/22/2006  Job:  [id number]

## 2010-10-24 NOTE — Discharge Summary (Signed)
[name], [name]               ACCOUNT NO.:  [id number]   MEDICAL RECORD NO.:  [id number]          PATIENT TYPE:  INP   LOCATION:  5502                         FACILITY:  [facility]   PHYSICIAN:  Corinna L. Lendell Caprice, MDDATE OF BIRTH:  1928-02-24   DATE OF ADMISSION:  10/21/2005  DATE OF DISCHARGE:  11/03/2005                           DISCHARGE SUMMARY - REFERRING   _____  ADDENDUM TO DISCHARGE SUMMARY   This is an addendum to the previously dictated discharge summary by Dr.  _____.  On the day of discharge, the patient is afebrile and has stable  vital signs.  Her exam is unremarkable.  Her INR today is 1.9.  At this  point, I am recommending that she be on vancomycin 1 gram IV q.12 h through  December 01, 2005.  Any adjustments to be made by pharmacy.  She also will need  rifampin 300 mg p.o. b.i.d. through December 01, 2005.  She will get Coumadin 5  mg nightly.  She will need an INR in 2 days and any adjustments in her  Coumadin can be made at that time.  INR should remain between 2 and 3.  I  would recommend that she not be on clindamycin but the other medications as  per Dr. _____ Discharge summary.      Corinna L. Lendell Caprice, MD  Electronically Signed     CLS/MEDQ  D:  11/03/2005  T:  11/03/2005  Job:  [id number]

## 2010-10-24 NOTE — H&P (Signed)
NAME:  [name], [name]                         ACCOUNT NO.:  [id number]   MEDICAL RECORD NO.:  [id number]                   PATIENT TYPE:  INP   LOCATION:  5740                                 FACILITY:  [facility]   PHYSICIAN:  Corinna L. Lendell Caprice, MD             DATE OF BIRTH:  March 23, 1928   DATE OF ADMISSION:  08/28/2003  DATE OF DISCHARGE:                                HISTORY & PHYSICAL   CHIEF COMPLAINT:  Weakness.   HISTORY OF PRESENT ILLNESS:  [name] is a pleasant 77 year old white  female who has been ill for 2 days who fell and could not get up.  She  called her neighbor and he found her on the floor.  She had been there for  30 minutes and he called 911.  Patient has had severe weakness since Sunday,  she has had a cough which has been nonproductive, high subjective fevers and  chills, she is having severe myalgias mainly involving the legs, she has had  a lot of sinus congestion, frontal headache, anorexia.  She denies vomiting,  diarrhea, rash, or sick contacts.  She did have a flu shot this year and she  has had a Pneumo vaccine within the past 5 years.  She has not eaten much  for the past 2 days.  She lives with her son but he works during the day and  when she fell he was not at home.   PAST MEDICAL HISTORY:  1. Coronary artery disease followed by Dr. Alanda Amass with history of     coronary artery bypass graft, stent placement in August 2003, and     preserved ejection fraction as of 2003.  2. Diabetes.  3. Hyperlipidemia.  4. Osteoarthritis.  5. Hypertension.   SOCIAL HISTORY:  Patient lives with her son.  She does not smoke or drink.   FAMILY HISTORY:  Coronary artery disease.   PAST SURGICAL HISTORY:  She has had coronary artery bypass grafting.  She  has had bilateral total knee replacements.   REVIEW OF SYSTEMS:  As above, otherwise negative.   PHYSICAL EXAMINATION:  VITAL SIGNS:  Her temperature initially was 102.7 and  peaked at 103.4.  Blood  pressure initially was 197/83, it is currently  139/64.  Pulse initially 102, currently 86.  Respiratory rate 20.  Oxygen  saturation 93% on room air.  GENERAL:  Patient is very weak appearing and ill.  HEENT:  Normocephalic, atraumatic.  Pupils equal, round and reactive to  light.  Sclerae nonicteric.  She has some mild scleral injection.  Tympanic  membranes are clear.  She has some slight erythema of the canal on the  right.  Her turbinates are boggy.  She has dry mucous membranes.  Her  tonsils are somewhat edematous; no exudate.  NECK:  Supple.  She has shotty lymphadenopathy submandibular area.  No JVD.  No thyromegaly.  LUNGS:  Clear to auscultation bilaterally  without wheezes, rhonchi, or  rales.  CARDIOVASCULAR:  Regular rate and rhythm without murmurs, gallops, or rubs.  ABDOMEN:  Normal bowel sounds, soft, nontender, nondistended.  GU AND RECTAL:  Deferred.  EXTREMITIES:  No clubbing, cyanosis, or edema.  Pulses are intact.  NEUROLOGIC:  Patient is alert and oriented.  Cranial nerves are intact.  She  has generalized weakness but nothing focal.  Sensory exam is normal grossly.  PSYCHIATRIC:  Patient is calm, alert and oriented, cooperative.  SKIN:  No rash.   LABORATORIES:  CBC is unremarkable.  Basic metabolic panel significant for a  potassium of 3.3, glucose of 197, otherwise unremarkable.  UA reveals 500  glucose, 15 ketones, 30 protein, negative nitrites, negative leukocyte  esterase.  Rapid Flu swab was positive for influenza A.  Blood cultures  negative and are pending.  Chest x-ray shows nothing acute.   ASSESSMENT AND PLAN:  1. Acute influenza A causing severe generalized weakness, dehydration and     myalgias.  Patient will be admitted to the floor.  She will get IV     fluids, Tylenol as needed.  I will also start Tamiflu.  She was given a     dose of Avelox but as she has no signs of bacterial infection this will     be stopped.  I suspect her myalgias are from  the flu but as she is on     Crestor I will check a CPK to rule out a rhabdomyolysis.  I will also     replete her potassium as this may be contributing to some of her aches     and possibly muscle cramps.  2. Coronary artery disease, stable.  Continue her outpatient medications     with the exception of Lasix.  3. Diabetes.  I will hold her Glucophage for now and continue her Amaryl at     half dose and give sliding scale insulin coverage per protocol.  4. Hyperlipidemia.  5. Hypertension.  6. Osteoarthritis.  I will continue all of her other outpatient medications.                                                Corinna L. Lendell Caprice, MD    CLS/MEDQ  D:  08/28/2003  T:  08/29/2003  Job:  [id number]   cc:   Meredith Staggers, M.D.  [address]  [place]  [place] [address]  Fax: [phone]   Richard A. Alanda Amass, M.D.  [phone] [address]  [place]  [place] [address]  Fax: [phone]

## 2010-10-24 NOTE — Discharge Summary (Signed)
[name], [name]               ACCOUNT NO.:  [id number]   MEDICAL RECORD NO.:  [id number]          PATIENT TYPE:  INP   LOCATION:  1431                         FACILITY:  [facility]   PHYSICIAN:  Madlyn Frankel. Charlann Boxer, M.D.  DATE OF BIRTH:  11/24/1927   DATE OF ADMISSION:  03/23/2006  DATE OF DISCHARGE:                                 DISCHARGE SUMMARY   ADMISSION DIAGNOSES:  1. Status post left total knee arthroplasty infection with subsequent      arthrotomy and debridement and polyethelene exchange and IV      antibiotics.  2. Recent infected pacemaker with subsequent removal and secondary      bacteremia.  3. History of atrial fibrillation and chronic Coumadin use.  4. Coronary artery disease status post CABG.  5. Diabetes.  6. Hypertension.  7. Hyperlipidemia.   DISCHARGE DIAGNOSES:  1. Status post left total knee replacement subsequent to prior infection      and subsequent arthrotomy and debridement and polyethelene exchange and      IV antibiotics.  2. Recent infected pacemaker with subsequent removal and secondary      bacteremia.  3. History of atrial fibrillation with chronic Coumadin use.  4. Coronary artery disease status post coronary artery bypass graft.  5. Diabetes.  6. Hypertension.  7. Hyperlipidemia.   PROCEDURES:  The patient was admitted to the hospital for a removal of  cemented antibiotic impregnated spacer with a new knee prosthesis implant.  The patient had received a course of IV antibiotics over a period of six  weeks.  She responded well and was indicated to go ahead and re-implant a  new prosthesis.   CONSULTATIONS:  Dr. Alanda Amass with [facility].   BRIEF HISTORY:  [name] is a 77 year old female who was 8 weeks status  post removal and implant of her left total knee with the insertion of an  antibiotic impregnated spacer.  The antibiotics were discontinued at the end  of September.  Since she had the removal and the  course of antibiotics, she  has had no new onset of any fevers, chills, muscle aches or pains.   LABORATORY DATA:  CBC preadmission within normal limits.  Hemoglobin 13.8,  hematocrit 41.6.  On October 19, hemoglobin 10.5, hematocrit 31.1, white  cell differential shows all within normal limits.   Coagulation preadmission:  Her PTT/INR 15.130, INR 0.12.  On October 18, PT  14.3, INR 1.1.  On October 19, PT 17, INR 1.3.  On October 20, PT 18.8, INR  1.5.  On October 21, PT 17.6, INR 1.4.  Routine chemistries preadmission:  Her potassium 4.9, calcium 10.8.  On October 18, glucose 196, all other  values normal.  On October 19, glucose 125, all other values normal.  On  October 20, last chemistry before discharge, glucose 119.   Cardiac markers taken during the course of stay, October 18, after  experiencing chest pain, her Troponin was 0.03.  Subsequent lab values on  October 19 were 0.02.  Preadmission urinalysis showed a small amount of  leukocyte esterase.  Gram  stain during reimplantation of the left knee  showed no white blood cells, no other organisms seen.  Tissue of the left  knee showed a few white blood cells.  No organisms seen.  Wound and tissue  cultures of the knee:  No organisms seen.  No growth after two days.  Anaerobic cultures taken of the knee showed no organisms seen.  No anaerobes  isolated.  Preadmission, EKG showed atrial fibrillation with no recent  changes noted during the course of her hospital stay on October 18.  Did  show atrial fibrillation with rapid ventricular response.  Cardiology did  review all EKG's.   RADIOLOGY:  Portable chest x-ray taken in August, chest view showed mild  chronic bronchitic changes, mild cardiomegaly and no evidence for chest  disease.   HOSPITAL COURSE:  After an 8-week course stay, the patient was brought to  the [facility] where a total knee replacement was performed.  The  patient tolerated the procedure well and  was admitted to the Orthopedic  floor.  Postop day #1, found the patient was in no acute distress.  She was  sore, but the Hemovac was pulled.  Her pain was controlled with analgesia.  PT was recommended for weightbearing as tolerated to increase her range of  motion and to place her on the CPM.  A consult for Coumadin with pharmacy  was called, and they made an assessment on October 18, and their plan was  for Coumadin beginning with 4 mg one time a day with daily INR measurement  with a goal of INR 2-3.  In the meantime, we will use Lovenox bridging.  Initial INR readings were 1.1 on October 18 by pharmacy.  On October 18 at 9  o'clock, the patient began to complain of chest pain.  Stated that it was  left anterior chest pain, sharp, stabbing with chest pressure.  She stated  that the pain lasted approximately 20 minutes and then it abated  spontaneously.  Consult was called in with her Cardiologist, Dr. Alanda Amass,  who cam in and evaluated.  The EKG did reveal some atrial fibrillation with  RVR, but there were no acute changes compared to her EKG done on August 7.  Cardiology continued to follow the patient throughout the course of their  stay.  They did recommend that she be transferred to telemetry and to cycle  other cardiac enzymes.  At the time of the consult, her troponin was 0.03.  Cardiology did begin her on 25 mg of Toprol daily for atrial fibrillation  control, and to continue the Lovenox bridging to the therapeutic INR with  Coumadin.  I also gave her Cardizem 10 mg IV, as well as 5 mg an hour to  titrate and get her heart rate below 90.  INR continued to be managed by  pharmacy with Lovenox bridging.  Postop day #2, the patient was doing well,  no new onset of chest pain.  Continued her on Lovenox prophylaxis.  Did  encourage her that she needed aggressive PT.  OT noted on October 19 showed that she was begging not to get out of bed.  OT did make a note that she did  need to  be in some sort of rehab and her skilled nursing facility.  Cardiology continued to follow and found her to be in no acute cardiac  distress.  On October 19, pharmacy continued to manage her INR which was 1.3  and managed her Coumadin and  Lovenox.  Physical therapy showed her to have  made minimal progress as far as her physical therapy.  She was only able to  basically get out of bed and into a chair.  She was able to move in bed with  moderate assistance and able to transfer with moderate assistance.  Was able  to travel up to 1.10 feet.  Rehab therapy did educate the patient on bed  mobility, bed transfers, ambulations and did recommend at least five times  per week physical therapy.  Over the course of the weekend on October 20,  the patient continued to improve and do well with recommendation that the  patient continue physical therapy.  There were two small blisters that were  found on her, near the incision site.  On October 21, the patient continued  to do well, was still encouraged to be weight bearing as tolerated.  Cardiology continues to follow as well as pharmacy.  By October 22, the  patient was much improved, responded to all medical therapy.  INR has been  slow to respond, but will continue to check and manage to reach therapeutic  value of 2-3.  On the last round seen, the patient was awake, alert and  oriented.  She showed no respiratory distress.  There was some mild bloody  drainage with blisters present.  Otherwise, her condition was stable and  improved.   CONDITION ON DISCHARGE:  Stable and improved.   DISCHARGE INSTRUCTIONS:  1. Diet:  Regular as tolerated by patient.  2. Activity:  Encourage physical therapy five times per week.  The primary      goals of physical therapy rehabilitation should be to maximize the      range of motion, to minimize pain, improve muscle strength, promote      ambulation and encourage independence in performing the activities of       daily living.   DISCHARGE MEDICATIONS:  1. Coumadin as managed by pharmacy.  2. Albuterol 2 mg p.o. q.h.s.  3. Valium 2 mg p.o. q.h.s.  4. Prilosec 20 mg p.o. daily.  5. NovoLog insulin on a sliding scale.  6. Xanax 0.25 mg p.o. p.r.n.  7. Robaxin 500 mg p.o. p.r.n.  8. Tylenol 325 mg p.o. p.r.n.  9. Phenergan 25 mg p.o. p.r.n.  10.Reglan 10 mg p.o. p.r.n.  11.Duragesic patch 25 mcg topical every three days.  12.Lipitor 20 mg p.o. daily.  13.Lexapro 10 mg p.o. daily.  14.Cardizem per Cardiology recommendations on admission was 180 mg p.o.      daily.  15.Avapro 300 mg p.o. daily.  16.Lasix 40 mg p.o. daily.  17.__________ gel topical b.i.d.  18.Glucophage 1000 mg p.o. b.i.d.  19.Glucotrol 5 mg p.o. b.i.d.  20.Colace 100 mg p.o. b.i.d.  21.Iron 325 mg p.o. daily.  22.K-Dur 20 mEq p.o. t.i.d.  23.Pain control will be with hydrocodone 3/325 1-2 p.o. q.4-6 p.r.n. pain. 24.Will continue Lovenox bridging until INR is therapeutic with Coumadin.  25.Will continue Coumadin management through pharmacy.   DISCHARGE PLAN:  The patient is to be discharged to [facility] in the  skilled nursing facility.   DISCHARGE FOLLOWUP:  The patient will follow up with Dr. Charlann Boxer within 8-10  days for removal of sutures.  She should also follow up with her  Cardiologist if she has any other problems or atrial fibrillation.  A copy  of note will be sent to her cardiologist and primary care physician.     ______________________________  Yetta Glassman. Loreta Ave,  PA      Madlyn Frankel. Charlann Boxer, M.D.  Electronically Signed    BLM/MEDQ  D:  03/29/2006  T:  03/29/2006  Job:  [id number]   cc:   Gerlene Burdock A. Alanda Amass, M.D.  Fax: [phone]   Joycelyn Rua, M.D.  Fax: [phone]

## 2010-10-24 NOTE — Discharge Summary (Signed)
NAMEDLISA, BARNWELL               ACCOUNT NO.:  [id number]   MEDICAL RECORD NO.:  [id number]          PATIENT TYPE:  INP   LOCATION:  1516                         FACILITY:  [facility]   PHYSICIAN:  Georges Lynch. Gioffre, M.D.DATE OF BIRTH:  04/06/1928   DATE OF ADMISSION:  01/12/2006  DATE OF DISCHARGE:                                 DISCHARGE SUMMARY   ADMISSION DIAGNOSES:  1. Re-infection of left total knee arthroplasty.  2. Recent history of infected cardiac pacer, subsequent removal with      secondary bacteremia.  3. History of recent urinary tract infections.  4. History of atrial fibrillation on chronic Coumadin therapy.  5. History of coronary artery disease status post coronary artery bypass      grafting.  6. Diabetes mellitus type 2.  7. Hypertension.  8. Hyperlipidemia.   DISCHARGE DIAGNOSES:  1. Infected left total knee arthroplasty with removal of all hardware and      implantation of a antibiotic impregnated polymethylmethacrylate spacer      with intravenous antibiotic treatment.  2. Methicillin-sensitive Staphylococcus aureus infection, on intravenous      Ancef for a total of 6 weeks.  3. Postoperative blood loss anemia, corrected with packed red blood cells.  4. Postoperative acute renal insufficiency, corrected with intravenous      hydration and blood.  5. History of atrial fibrillation on chronic anticoagulation therapy.  6. History of coronary artery disease status post coronary artery bypass      grafting.  7. History of recent urinary tract infection.  8. History of recent infected cardiac pacer with subsequent removal      secondary to bacteremia.  9. Diabetes mellitus type 2.  10.Hypertension.  11.Hyperlipidemia.   HISTORY OF PRESENT ILLNESS:  The patient is a 77 year old female who is  admitted to [facility] with a reinfection of her left total knee  arthroplasty.  The initial infection grew out MSSA, was treated with IV  antibiotics about  5 weeks.  She had been off the antibiotics since June 26  and the week prior to admission she had reoccurrence of severe pain.  She  was seen in the office.  Aspiration of purulent material was performed that  recultured as MSSA and the patient was admitted for removal of hardware and  initiation of IV antibiotics.   MEDICATIONS ON ADMISSION:  1. Lasix 40 mg a day.  2. K-Dur 20 mEq p.o. t.i.d.  3. Diovan 160 mg a day.  4. Glipizide 10 mg a day.  5. Metformin 1000 mg a day.  6. Lovastatin 80 mg a day.  7. Diazepam 2 mg q.h.s.  8. Aspirin 81 mg a day.  9. Coumadin 2.5 mg on Tuesdays, Thursdays; 3 mg on Mondays, Wednesdays,      Saturdays, and Sundays.   ALLERGIES:  PENICILLIN.   SURGICAL PROCEDURE:  On January 19, 2006, the patient was taken to the OR by  Dr. Lajoyce Corners, assisted by Dr. Worthy Rancher.  Under general anesthesia, the  patient underwent a removal of the left total knee arthroplasty hardware  with debridement and  implantation of antibiotic-impregnated  polymethylmethacrylate spacer.  Cultures and sensitivities of the knee were  sent to pathology and laboratory for evaluation.  Estimated blood loss was  50 mL.  The patient tolerated the procedure well, was transferred to the  recovery room and then to the orthopedic floor in good condition.   CONSULTS:  The following consults were requested:  1. [facility] consult for medical management of her multiple      issues.  2. Infectious disease consult was requested for recommendations of IV      antibiotic treatments.   HOSPITAL COURSE:  On January 12, 2006, the patient was admitted to [facility] under the care of Dr. Ranee Gosselin.  The patient was started on  IV vancomycin, place on the orthopedic floor, bedrest, pain management, and  the patient was just treated symptomatically until an operative time was  available for Dr. Darrelyn Hillock and Dr. Charlann Boxer to remove the prosthesis.  The  patient's cultures came back.   They did reveal that she had MSSA infection  and her antibiotics were adjusted and corrected per infectious disease.  The  patient's medical issues were addressed by medical services for her  diabetes.  On August 14 the patient was taken to the OR where the prosthesis  was removed without any complications.  The patient was transferred to the  ICU due to the amount of blood loss and her cardiac history for monitoring  issues only.  The patient did develop some significant postoperative blood  loss anemia.  Her hemoglobin dropped the first time to 7.6.  She was typed  and crossed and transfused a total of 4 units of packed red blood cells with  her hemoglobin improving to 11.9 without any untoward events.  The patient  also did develop some slight renal insufficiency but this did correct with  hydration and blood transfusion.  The patient's pain was well controlled  throughout her hospitalization.  Her diabetes was well controlled with  medications and sliding scale.  The patient's pain was well controlled.  She  had no other significant untoward events throughout her hospitalization from  the medical or orthopedic standpoint and was felt on postoperative day #7  that she was orthopedically and medically stable and ready for transfer to a  skilled nursing facility for nursing care issue while she has no knee joint  in place and she is receiving IV antibiotics.  Arrangements were made and  she will be available for transfer on August 22 in good condition.   LABORATORY:  Hemoglobin on admission was 10.2, postoperatively dropped to  7.6 on August 16.  She was typed and crossed and transfused 4 units of  packed red blood cells with improvement on August 21 to 12.1.  August 21,  INR was 2.5.  A sed rate on August 7 was 90 with a C reactive protein on  August 7 of 13.1.  Wound tissue cultures showed diffuse Staphylococcus  aureus sensitive to all antibiotics except for penicillin, which  was consistent with the aspirate specimen sent to [facility].  The patient  received a total of 4 units packed red blood cells during her  hospitalization.  Chest x-ray on admission showed mild chronic  bronchiectatic changes, mild cardiomegaly, no evidence of active chest  disease.  EKG on admission was atrial fibrillation at 84 beats per minute.   MEDICATIONS UPON DISCHARGE FROM ORTHOPEDICS FLOOR:  1. Lasix 40 mg a day.  2. Potassium 20 mEq  p.o. t.i.d.  3. Avapro 300 mg a day.  4. Zocor 40 mg a day.  5. Valium 2 mg p.o. q.h.s.  6. Ventolin 4 mg tablet p.o. q.h.s.  7. Glucophage 1000 mg b.i.d. w.c.  8. Ferrous sulfate 325 mg p.o. t.i.d.  9. Colace 100 mg p.o. b.i.d.  10.Diltiazem/Tiazac 30 mg p.o. q.6h.  11.Protonix 20 mg a day.  12.Glucotrol 5 mg b.i.d.  13.Robaxin 500 mg p.o. q.6h. p.r.n.  14.Ancef 1 g IV q.8h.  15.Xanax 0.25 mg p.o. b.i.d. p.r.n.  16.Reglan 10 mg p.o. q.8h. p.r.n.  17.Phenergan 25 mg p.o. q.6h. p.r.n.  18.Tylenol 650 mg p.o. q.6h. p.r.n.  19.She is currently being followed on a glycemic control sliding scale for      t.i.d. a.c. and q.h.s. with h.s. insulin coverage, NovoLog moderate      sliding scale.  20.Oxycodone, OxyIR 5 mg tablets, one to three tablets p.o. q.4h. p.r.n.   DISCHARGE INSTRUCTIONS:  1. Diet:  The patient is to maintain a 2000-calorie ADA diet.  2. Activity:  The patient may be bed to chair with assistance and long leg      immobilizer on left knee at all times.  If the patient desires, she may      weight-bear as tolerated on left leg with knee immobilizer on with      walker and assistance.  3. Wound care:  The patient should have wound checked daily for any signs      of infection.  She is to have staples removed 14 days from date of      surgery.  Discharge day is day #14.  Please apply Steri-Strips when      staples are removed.  4. Medications:  The patient is to continue medications as noted above.      The patient is to have  IV Ancef for a total of 5 weeks from the date of      discharge from [facility].  If there are any questions about      IV antibiotics, the facility is to contact the infectious disease      office at [facility] for discussion with infectious      disease M.D.  5. Followup:  The patient needs a followup appointment with Dr. Darrelyn Hillock in      his office 2 weeks from discharge from [facility].  Please      call [phone] for an appointment.   CONDITION UPON DISCHARGE FROM [facility]:  Listed as improved and  good.      Jamelle Rushing, P.A.    ______________________________  Georges Lynch Darrelyn Hillock, M.D.    RWK/MEDQ  D:  01/26/2006  T:  01/26/2006  Job:  [id number]   cc:   Joycelyn Rua, M.D.  Fax: [phone]

## 2010-10-24 NOTE — Consult Note (Signed)
NAMECARLEEN, [name]               ACCOUNT NO.:  [id number]   MEDICAL RECORD NO.:  [id number]          PATIENT TYPE:  INP   LOCATION:  1408                         FACILITY:  [facility]   PHYSICIAN:  Hollice Espy, M.D.DATE OF BIRTH:  07/24/1927   DATE OF CONSULTATION:  01/13/2006  DATE OF DISCHARGE:                                   CONSULTATION   PRIMARY CARE PHYSICIAN:  Joycelyn Rua, M.D.   REASON FOR CONSULTATION:  Medical management of the patient's chronic  medical issues.   HOSPITAL COURSE:  The patient is a 77 year old white female who was  discharged from [facility] in May 2007, after she was found to have  an infected left total knee arthroplasty that grew out MRSA.  At that time,  she was discharged on IV vancomycin after having open arthrotomy and total  debridement and synovectomy of her left knee.  Since then the patient was  able to be transitioned over to IV clindamycin as an outpatient up until  December 01, 2005, and about 1 week prior to June 26, the patient started having  problems with recurrent severe knee pain with swelling.  These symptoms  continued to persist and the patient followed up in the office of Dr.  Darrelyn Hillock.  At that time, the patient's knee was tapped and significant  purulent material came out from the internal joint capsule.  This was sent  off for culture and the patient was arranged by Dr. Darrelyn Hillock to come in for  IV antibiotics and plans for surgical removal of her total knee arthroplasty  with antibiotic impregnated spacer and continued outpatient IV antibiotics  for the next 1-2 months.  Plan is going to be for now to continue to monitor  the patient on IV antibiotics with removal of her total knee arthroplasty in  approximate one week's time.  [facility] were consulted for  medical management as the patient has a history of atrial fibrillation,  diabetes, and hypertension.  Currently the patient is complaining of some  knee pain, she said is mild compared to how it was yesterday when they  tapped her knee.   REVIEW OF SYSTEMS:  She denies any headaches, vision changes, chest pain,  palpitations, shortness of breath, wheeze, cough, abdominal pain, hematuria,  dysuria, constipation, diarrhea, and no other pain in other extremities or  focal weakness or numbness.  She does feel quite fatigued.  Review of  systems otherwise negative.   PAST MEDICAL HISTORY:  1. Recent infected pacemaker with subsequent removal and secondary      bacteremia.  2. History of UTI.  3. Recent left total knee arthroplasty infection with subsequent      arthrotomy debridement and polyethylene exchange.  4. History of AFib on chronic anticoagulation.  5. History of CAD, status post CABG.  6. Diabetes mellitus, type 2.  7. Hypertension.  8. Hyperlipidemia.   MEDICATIONS:  The patient's home medications are:  1. Lasix 40 daily.  2. K-Dur 20 mEq p.o. t.i.d.  3. Diovan 160 daily.  4. Glipizide 10 daily.  5. Metformin 1,000 daily.  6. Lovastatin 80 daily.  7. Diazepam 2 p.o. q.h.s.  8. Aspirin 81 daily.  9. Coumadin 2.5 mg on Tuesday, Thursday; 3 mg on Monday, Wednesdays,      Saturdays, and Sundays.  10.There is also a question of whether the patient was on Plavix based on      her discharge back in May but I do not see any indications at this      time.   Medications currently that she is receiving in the hospital are as follows:  1. Albuterol inhaler 2 puffs nightly.  2. Aspirin 81 p.o. daily.  3. Valium 2 p.o. q.h.s.  4. Lovenox 85 mg subcutaneous q.12 h.  5. Lasix 40 mg daily.  6. Glucotrol 10 p.o. daily.  7. Insulin sliding scale.  8. Avapro 300 p.o. daily.  9. Glucophage 1,000 daily.  10.K-Dur 20 mEq p.o. t.i.d.  11.Zocor 40 p.o. daily.  12.She is receiving IV fluids at [facility].  13.Vancomycin as per pharmacy.  14.Percocet, Robaxin p.r.n.  Her Coumadin is on hold.   ALLERGIES:  THE PATIENT HAS ALLERGIES TO  PENICILLIN.   SOCIAL HISTORY:  She currently does not smoke, do any drugs, or drink  alcohol.   FAMILY HISTORY:  Noncontributory.   PHYSICAL EXAMINATION:  VITAL SIGNS:  Most recent set:  Temp 98, heart rate  80, blood pressure __________  , respirations 16, O2 sat 99% on room air.  GENERAL:  The patient is alert and oriented x3.  She is slightly anxious to  the upcoming plans for surgery.  HEENT:  Normocephalic atraumatic.  Mucous membranes are moist.  She has no  carotid bruits.  HEART:  Currently heart sounds to be more regular, although there are  occasional ectopic beats.  This may be a slow onset atrial fibrillation.  LUNGS:  Clear to auscultation bilaterally.  ABDOMEN:  Soft, nontender, nondistended.  Positive bowel sounds.  EXTREMITIES:  No clubbing, cyanosis.  She has trace pitting edema with some  swelling and tenderness significant in her left knee.  Poor peripheral  pulses.   LABORATORY:  CBG:  On December 12, 2005, her CBGs at 5 p.m. are 27; on July 31  86.  Blood sugar checked again at noon on January 13, 2006, it was 211.  Other  lab work:  CRP is 13.1 and elevated.  ESR is 90 and elevated.  Sodium 136,  potassium 4, chloride 103, bicarb 28, BUN 13, creatinine 0.88, glucose 200.  LFTs are noted only for an albumin of 2.8.  Currently, her INR is sub-  therapeutic at 1.1.  White count 6.3, H&H 10.3 and 30.9, MCV of 87, platelet  count 247, 62% neutrophils.   ASSESSMENT/PLAN:  1. 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  Agree with      given previous MRSA, continue with intravenous vancomycin.  Await      pending cultures and plans for orthopedic surgery to remove this      arthroplasty.  2. Diabetes mellitus.  Currently the patient is on a moderate scale as      well as carbohydrate-modified diet.  We will continue with her insulin     sliding scale depending on her oral intake.  Given that the patient is      continuing to receive her oral medications, we  may need to go to a      resistant scale, but we will watch for now.  3. Hypertension.  Currently her blood pressure is doing  well and will      continue on her previous medication.  4. Atrial fibrillation with chronic anticoagulation.  Currently, Coumadin      is on hold for plans for upcoming surgery, and she is on Lovenox which      I agree with.  5. History of renal insufficiency.  Right now her BUN and creatinine      appear to be at its normal baseline close to normal and I agree with      continuing her Avapro.  6. Hyperlipidemia.  Continue her Statin.      Hollice Espy, M.D.  Electronically Signed     SKK/MEDQ  D:  01/13/2006  T:  01/13/2006  Job:  [id number]   cc:   Joycelyn Rua, M.D.  Fax: [phone]   Georges Lynch. Darrelyn Hillock, M.D.  Fax: [phone]

## 2010-10-24 NOTE — Consult Note (Signed)
NAMEAVIKA, CARBINE               ACCOUNT NO.:  [id number]   MEDICAL RECORD NO.:  [id number]          PATIENT TYPE:  INP   LOCATION:  5502                         FACILITY:  [facility]   PHYSICIAN:  Georges Lynch. Gioffre, M.D.DATE OF BIRTH:  21-Dec-1927   DATE OF CONSULTATION:  DATE OF DISCHARGE:                                   CONSULTATION   We were called to see Mrs. [name] today because of a possible infection of  her left total knee.  Her history goes back to a few months ago, when she  developed a urinary tract infection.  She had bacteremia, and she had an  infected pacemaker on the left.  Since that time her pacemaker has been  removed.  She said she went home and was doing well, and then for the past  few days she felt very weak and was unable to get up off the floor at home.  She was admitted to the hospital by Dr. Tresa Endo on Oct 21, 2005.  We were  called today on Oct 22, 2005 to see her.  She apparently has been afebrile  while in the hospital.  She was seen this afternoon by my physician  assistant while I was in surgery, and her left knee was aspirated, and the  aspirate was a very dark-brown-type material that appeared to be infected.  A STAT Gram's stain was done that showed Gram-positive cocci in pairs.  A  culture for aerobic and anaerobic was sent from the left knee.  The x-rays  of her left knee really were unremarkable.  In fact, we had x-rays in the  office on Oct 15, 2005, when one of my associates saw her, and her x-rays  were unremarkable.  She had a left total knee arthroplasty done about 10  years ago.  She has actually bilateral __________ total knee arthroplasties.  So the bottom line is she developed a secondary infection in her left total  knee from her bacteremia, either per urinary tract or her infected  pacemaker.  Her pacemaker  has been removed.  She was initially on Cipro,  and she is now on vancomycin.  In our plan, and in fact further going over  her  exam, on her knee she does have a definite knee effusion.  There is no  marked erythema.  It does not really present like a septic knee at this  point.  She has been afebrile, but the Gram's stains showed that she did  obviously have an infected left total knee.   Her INR level was 7.7.  Her PT was out of control, and basically she is  being treated by Dr. Tresa Endo at this time to bring the INR and PT down to  within normal range.  She is on vitamin K as well, and she probably will  need fresh frozen plasma.  Basically her hemoglobin was 8.7.  She is being  transfused with 2 units of packed cells at this time as well.  Basically my  associate, Dr. Cornelius Moras, will, providing the patient is ready, take her to  surgery  Saturday and remove the polyethylene liner and do an open  debridement of her knee and replace the polyethylene liner.           ______________________________  Georges Lynch. Darrelyn Hillock, M.D.     RAG/MEDQ  D:  10/22/2005  T:  10/23/2005  Job:  [id number]   cc:   Sherin Quarry, MD

## 2010-10-24 NOTE — Op Note (Signed)
[name], [name]               ACCOUNT NO.:  [id number]   MEDICAL RECORD NO.:  [id number]          PATIENT TYPE:  INP   LOCATION:  2007                         FACILITY:  [facility]   PHYSICIAN:  [name], M.D.  DATE OF BIRTH:  09-Dec-1927   DATE OF PROCEDURE:  08/18/2005  DATE OF DISCHARGE:                                 OPERATIVE REPORT   PROCEDURE PERFORMED:  Pacemaker lead and generator extraction.   INDICATIONS:  Pacemaker pocket infection with bacteremia.   INTRODUCTION:  The patient is 77 year old woman with a history of coronary  disease and tachybrady syndrome, who underwent permanent pacemaker  implantation approximately six weeks ago.  The patient however, developed  drainage from her pocket and fevers and chills and blood cultures  demonstrated methicillin-sensitive Staphylococcus aureus bacteremia.  She  had drainage from her incision.  She was placed on antibiotics and improved  symptomatically.  She is now referred for pacemaker generator and lead  removal.   PROCEDURE:  After informed consent was obtained, the patient was taken to  the diagnostic EP lab in fasting state.  After usual preparation and  draping, inv fentanyl and Midazolam was given for sedation.  30 mL of  lidocaine was infiltrated into the left infraclavicular region.  The  previously inserted pocket was excised and a rush of purulent fluid  immediately was expressed.  The pocket was opened and the old Medtronic  pacemaker was removed.  A stylet was placed into both atrial and ventricular  leads.  The atrial lead was an active fixation lead which was with the helix  retracted back into the housing of the lead body itself and removed with  gentle traction.  The ventricular lead which was a passive fixation lead was  also removed with gentle traction.  Hemostasis was obtained by holding  pressure and the pocket was irrigated with copious amounts of kanamycin.  The incision was closed with a single  layer of Prolene mattress suture.  A  pressure dressing was placed and the patient was returned to her room in  satisfactory condition.   COMPLICATIONS:  There were no immediate procedure complications.   RESULTS:  This demonstrates successful removal of the previously implanted  pacemaker generator and leads in a patient with pacemaker pocket infection  complicated by staph bacteremia.           ______________________________  [name], M.D.     GWT/MEDQ  D:  08/18/2005  T:  08/19/2005  Job:  [id number]   cc:   Darlin Priestly, MD  Fax: [phone]   Sherin Quarry, MD   Joycelyn Rua, M.D.  Fax: [phone]

## 2010-10-24 NOTE — Op Note (Signed)
[name], [name]               ACCOUNT NO.:  [id number]   MEDICAL RECORD NO.:  [id number]          PATIENT TYPE:  INP   LOCATION:  0154                         FACILITY:  [facility]   PHYSICIAN:  Jackie Frankel. Charlann Lowe, M.D.  DATE OF BIRTH:  [birth year]   DATE OF PROCEDURE:  01/19/2006  DATE OF DISCHARGE:                                 OPERATIVE REPORT   PREOPERATIVE DIAGNOSIS:  Infected left total knee replacement.   POSTOPERATIVE DIAGNOSIS:  Infected left total knee replacement.   PROCEDURE:  Resection arthroplasty, left total knee followed by placement of  antibiotic spacer with vancomycin and Tobramycin in the cement.   SURGEON:  Jackie Frankel. Charlann Lowe, M.D.   ASSISTANT:  [name], M.D.   ANESTHESIA:  General.   BLOOD LOSS:  Minimal.   TOURNIQUET TIME:  90 minutes at 300 mmHg.   DRAINS:  x1.   COMPLICATIONS:  None.   INDICATION FOR PROCEDURE:  [name] is a very pleasant 77 year old  female.  She is a patient of Dr. Worthy Rancher.  She had a history of left  total knee replacement performed 12 years ago.  Unfortunately, she had a  pacemaker that had been placed that got infected.  Approximately 3 months  ago, she had had a infected left knee presumably from this source.   This was initially incised and drained with polyethylene exchange as a  treatment option for an acute infection.   She was placed on antibiotics per infectious disease consultation.  Unfortunately the infection did not clear and once her antibiotics infection  recurred in the left knee.  Risks and benefits of operative versus  nonoperative intervention were reviewed with [name] through Dr.  Darrelyn Lowe. Consent was obtained for resection arthroplasty.   PROCEDURE IN DETAIL:  The patient was brought to operative theater.  Once  adequate anesthesia was established, the patient was positioned supine.  Note that the patient had received vancomycin preoperatively in the  hospitalization as a gram of  vancomycin.   The left lower extremity prepped and draped in sterile fashion over a  nonsterile proximal thigh tourniquet.  The patient's previous incision was  excised including a slight tunneling of the skin without active drainage.   Extensor mechanism was identified and medial parapatellar arthrotomy  created.  Knee exposure was obtained in routine fashion.  Extensive  synovectomy was carried out at this point throughout this medial, lateral  and suprapatellar pouch to recreate anatomy.   Following this the knee was flexed.  Without complication the components  removed.  The femur component was removed using osteotomes to undermine the  cement, excessive cement was debrided.  There was minimal bone loss.  There  was some bone loss in the central anterior aspect of the femur but nothing  that the condyles.  Distal femoral height was maintained.  The femoral canal  was entered using the canal finder and later irrigated.   Following removal of the femoral component, attention was directed tibia.  Tibial component was removed first using the small thin oscillating saw to  undermine the cement following further exposure  and subluxation of the tibia  anteriorly and we were able to remove the tibial component without bone  loss.  Cement was removed throughout the surface as well as into the canal.  Again the canal was entered.  This point the attention was directed the  patella.  Using the oscillating saw patella was removed including all the  cement including the peg holes.   Post debridement of the patella left patella thickness roughly of 13-14 mm.   Following this debridement, the wound was copiously irrigated with 6 liters  normal saline solution including some that had bacitracin.  We did use the  canal brush to irrigate the canals and debride these as well.   Once this was done three bags of cement were mixed with 1 gram of vancomycin  and vial of tobramycin per bag of cement.   Once the cement was ready it was  made into an oval disk that measured 23 mm to fit between the femur and the  tibia to allow for the knee to come to extension.  I did apply some of the  cement as rolls into the canal of the tibia and femur to help for bone  penetration and cement antibiotic extravasation.   A thin sheet was made in the suprapatellar pouch as well.   Once this was done and the cement had cured, the spacers were placed.  A  medium Hemovac drain was placed deep.  Extensor mechanism was then  reapproximated using #1 PDS.  The remaining wound was closed in layers with  Vicryl with staples on the skin.  The skin was cleaned, dried and dressed  sterilely with Adaptic dressing sponges and sterile bulky dressing.  She was  then brought to the recovery room in stable condition.  She was extubated  brought to recovery in stable condition.   Postoperatively, she will be admitted to the floor were IV antibiotics will  be initiated.  Infectious Disease consultation will be obtained to follow  cultures.  Given failure of a go-around of cultures of IV antibiotics,  hopefully we will be able to improve the odds of this with resection  arthroplasty.      [name], M.D.  Electronically Signed     MDO/MEDQ  D:  01/19/2006  T:  01/20/2006  Job:  [id number]

## 2010-10-24 NOTE — Discharge Summary (Signed)
NAMEKRISTL, [name]               ACCOUNT NO.:  [id number]   MEDICAL RECORD NO.:  [id number]          PATIENT TYPE:  EMS   LOCATION:  ED                           FACILITY:  [facility]   PHYSICIAN:  Hollice Espy, M.D.DATE OF BIRTH:  1927/07/24   DATE OF ADMISSION:  08/13/2005  DATE OF DISCHARGE:  08/22/2005                                 DISCHARGE SUMMARY   PRIMARY CARE PHYSICIAN:  Dr. Joycelyn Rua.   DISCHARGE DIAGNOSES:  1. Infected pacemaker lead.  2. Urinary tract infection with secondary sepsis.  3. Atrial fibrillation.  4. History of congestive heart failure.  5. Status post percutaneous transluminal coronary angioplasty.  6. Diabetes mellitus.  7. Acute renal failure, now resolved.  8. Status post removal of pacemaker without replacement.   Discharge medications for this patient are as follows:  1. Fosamax 70 p.o. q. week.  2. Potassium 20 mEq p.o. daily.  3. Plavix 75 p.o. daily.  4. Glipizide 10 p.o. daily.  5. Lovastatin 80 p.o. daily.  6. Valium 2 mg p.o. q.h.s.  7. Protonix 40 p.o. daily.  8. Diovan 160 p.o. daily.  9. Coumadin 5 mg p.o. q.h.s.  10.Plus continuation of IV vancomycin 1250 mg IV daily.  [facility]      care to follow.  11.Lasix 40 p.o. daily.   Discharge diet will be a heart-healthy diet with carb modified.   Patient will follow up with Dr. Jenne Campus as well as Dr. Silvestre Moment and Dr.  Ninetta Lights, infectious disease.  Home health will follow for wound care.  Patient will also follow up with [facility] on Thursday March 29th  at 9:15 a.m. for wound check.  Activity will be as per home-health PT.   HOSPITAL COURSE:  Patient is a 77 year old white female with past medical  history of CAD, A-fib on Coumadin, and diabetes mellitus who presented to  emergency room w/ complaints of dysuria and urinary frequency.  In addition,  she was found to have an elevated heart rate, temp of 101, and UTI with  possible sepsis.  Patient was  admitted, started on IV antibiotics, and  followed.  She was also found to be in mild acute renal failure with a  creatinine of 1.8.  Patient started on IV fluids, IV antibiotics, and  continue to follow her INR, was therapeutic at the time.  Patient on evening  of hospital day of admission was noted to find Gram-positive cocci clusters,  which had been done from previous lab work from the evening before.  She was  started on IV vancomycin in addition to her Cipro.  By August 14, 2005,  patient was found to be tolerating well, although her creatinine is slightly  worsened.  Her urine cultures were Gram-positive for E. coli, but her blood  culture is Gram-positive for Staph aureus.  It was felt that more likely  because of her symptoms the choice was made that it was more consistent that  she had an infected pacemaker site.  Dr. Jenne Campus of cardiology who placed it  the month prior was consulted.  She  was continued on vancomycin but blood  culture was positive for MRSA sensitive Staph aureus, which was a more  likely cause.  Given patient's slow to improve symptoms and worsening BUN  and creatinine, it was felt that it would be best that she stay on IV  vancomycin.  Patient continued to be evaluated.  Dr. Jenne Campus of cardiology  followed the patient on August 17, 2005, and infectious disease was consulted  as well.  Plan is per cardiology for a pacemaker removal, which infectious  disease agreed with.  In addition, patient got a 2D echo.  They recommended  continuing the vanco, continuing the Cipro for 10 to 14 days.  Over the next  day, the patient started to show some signs of improvement.  Her blood  sugars remained stable.  Her BUN and creatinine continued to improve, and by  March 13th, her creatinine was down to 1.  Patient on March 13th also had  her pacemaker removed as well.  Postprocedure she tolerated it well.  She  was continued on vancomycin as well as Cipro.  She tolerated it well  and  remained stable and was soon able to start her Coumadin back again.  Plan  was for ID to continue her vancomycin for the next 4 to 6 weeks with Cipro  until March 21st.  Patient continued to do well, no other complaints.  Started to feel better.  As mentioned, her Coumadin was restarted.  INR  improved to 1.2 on March 16th, and by March 17th, her INR was up to 1.9.  At  this point, she was thought to be medically stable and could go home.  Plan  will be for home health to follow up.  Continue the IV vancomycin.  She will  need a total of 6 weeks for that.   OVERALL DISPOSITION:  Improved.  She will be discharged to home.   Followup appointment as above.      Hollice Espy, M.D.  Electronically Signed     SKK/MEDQ  D:  03/02/2006  T:  03/02/2006  Job:  [id number]

## 2010-10-24 NOTE — H&P (Signed)
[name], [name]               ACCOUNT NO.:  [id number]   MEDICAL RECORD NO.:  [id number]          PATIENT TYPE:  INP   LOCATION:  5502                         FACILITY:  [facility]   PHYSICIAN:  Hollice Espy, M.D.DATE OF BIRTH:  May 10, 1928   DATE OF ADMISSION:  10/21/2005  DATE OF DISCHARGE:                                HISTORY & PHYSICAL   PRIMARY CARE PHYSICIAN:  Joycelyn Rua, M.D.   CHIEF COMPLAINT:  Weakness.   HISTORY OF THE PRESENT ILLNESS:  This patient is a 77 year old white female  with a past medical history of atrial fibrillation, CAD status post CABG in  1989, diabetes mellitus and obesity who last needed the hospital's services  approximately two months ago for a UTI and bacteriemia secondary to an  infected pacer.  The patient states that since she was discharged for the  last several days she has been feeling more and more weak.  She came into  the ER and she was noted to have a normal white count with no shift, but a  moderate UTI.  Other labs of note were a creatinine of 1.3 with a normal BUN  when previously creatinine had been 0.9 two months ago.  In addition, the  patient is noted to have a hemoglobin of 10.9 and hematocrit of 32; these  were the same as previously.   The patient, herself is doing well.  Her only complaint is left knee pain,  which has been ongoing for the past week.  She had an outpatient x-ray and  was told it was normal without any dislocations.  She is status post total  knee replacement.  She has no other complaints.   The patient denies any headaches, vision changes, dysphagia, chest pain,  palpitations, blood in her stool, hematuria, dysuria, abdominal pain,  constipation, change in her bowel habits, and black tarry stools.  Her only  complaint, again, is of left knee pain.   PAST MEDICAL HISTORY:  The patient's past medical history includes:  1.  Recent infected pacemaker with secondary bacteriemia.  2.  UTI.  3.  Status  post left total knee.  4.  A-fib.  5.  CAD status post CABG.  6.  Diabetes mellitus, poorly controlled.  7.  Hypertension.  8.  Hyperlipidemia.   MEDICATIONS:  The patient is unsure of all her medications.  The ones listed  from the ER intake include aspirin, Coumadin, Lasix, Diovan, Lopressor,  lovastatin, and K-Dur; but, the doses are not confirmed.  Based on her last  history and physical medications include:  1.  Plavix 72 mg.  2.  Zantac 300 mg.  3.  Coumadin.  4.  Fosamax 70 mg q. Weekly.  5.  __________  twice a day.  6.  Diovan 260 mg daily.  7.  Metformin 1 gram twice a day.  8.  Glipizide 10 mg twice a day.  9.  Lovastatin 40 mg daily.  10. Albuterol p.r.n.  11. Lasix 40 daily.  12. Kay-Ciel 10 twice a day.  13. Valium 2 at bedtime.   ALLERGIES:  The patient  has an allergy to PENICILLIN.   SOCIAL HISTORY:  The patient denies any tobacco, alcohol or drug use.  She  lives with her son.   PHYSICAL EXAMINATION:  VITAL SIGNS: The patient's vital signs on admission  are temp 99.7, heart rate 66, blood pressure 122/62, respirations 20, and O2  sat 98% on room air.  GENERAL APPEARANCE:  In general the patient is alert and oriented times  three.  She does smell of urine.  HEENT:  Head is normocephalic and atraumatic.  Mucous membranes are slightly  dry.  NECK:  The patient has no carotid bruits.  HEART:  The heart has an irregular rhythm, but rate-controlled.  LUNGS:  The lungs are clear to auscultation bilaterally.  ABDOMEN:  The abdomen is soft, obese and nontender.  Positive bowel sounds.  EXTREMITIES:  No clubbing, cyanosis or edema.   LABORATORY DATA:  UA shows 5 ketone, small hemoglobin, large leukocyte  esterase and nitrite positive with to numerous to count white cells, 90  bacteria and 0-2 red cells.  Sodium 138, potassium 4, chloride 106, bicarb  25, BUN 19, creatinine 1.3, and glucose 104.   ASSESSMENT AND PLAN:  1.  Urinary tract infection; this is likely  the cause of her weakness.   Treat with intravenous Cipro and intravenous fluids.   1.  Renal insufficiency likely secondary to number 1.   Treat with intravenous fluids.   1.  Weakness with left knee pain.  This may be due to arthritis.   We will get a physical therapy consult.   1.  Anemia; this appears to be chronic compared to how it was two months      ago.  2.  Diabetes mellitus.   We will confirm her home medication, put her on a sliding scale and check  her a hemoglobin A-1-C.   1.  Atrial fibrillation.   We will follow her prothrombin time, partial thromboplastin time and INR.      Hollice Espy, M.D.  Electronically Signed     SKK/MEDQ  D:  10/22/2005  T:  10/22/2005  Job:  [id number]   cc:   Joycelyn Rua, M.D.  Fax: [phone]

## 2010-10-24 NOTE — Cardiovascular Report (Signed)
[name], [name]               ACCOUNT NO.:  [id number]   MEDICAL RECORD NO.:  [id number]          PATIENT TYPE:  INP   LOCATION:  3705                         FACILITY:  [facility]   PHYSICIAN:  Darlin Priestly, MD  DATE OF BIRTH:  1928-05-20   DATE OF PROCEDURE:  07/15/2005  DATE OF DISCHARGE:                              CARDIAC CATHETERIZATION   PROCEDURE:  1.  Left heart catheterization.  2.  Coronary angiography.  3.  Left ventriculogram.  4.  Left internal mammary angiography.  5.  Left circumflex - mid - placement of intercoronary stent.   ATTENDING PHYSICIAN:  Darlin Priestly, M.D.   COMPLICATIONS:  None.   INDICATIONS FOR PROCEDURE:  [name] is a 77 year old female patient of  Dr. Franchot Heidelberg and Dr. Joycelyn Rua with a history of CAD status post  coronary artery bypass surgery in 1989 consisting of a LIMA to the LAD.  She  has also undergone a circumflex stenting as well as RCA intervention in  August 2003.  She is known to have a normal LVEF.  She was recently seen by  Dr. Lenise Arena and found to be in new onset atrial fibrillation.  She is now  admitted for IV heparin as well as cardiac catheterization with Coumadin  loading.   DESCRIPTION OF PROCEDURE:  After giving informed written consent, the  patient was brought to the cardiac cath lab were the right and left groins  were shaved, prepped and draped in a sterile fashion.  ECG monitoring was  established.  Using modified Seldinger technique, a 6 French arterial sheath  was inserted in the right femoral artery.  6 French diagnostic catheters  were used to perform diagnostic angiography.   The left main is a large vessel with no significant disease.   The LAD is a medium to large size vessel which courses to the apex and fills  competitively in the distal portion.  It does give rise to one small  diagonal branch.  The LAD is noted to be calcified in its proximal segment  with 15% proximal, 40% mid, and  70% distal mid lesion with kinking segment  noted.  There is a patent LIMA starting in the distal mid portion of the LAD  with competitive flow into the distal LAD, though there does not appear to  be any significant distal LAD disease.   The first diagonal is a small vessel with no significant disease.   There is a patent LIMA to the LAD which, again, fills competitively into the  distal LAD but appears to be widely patent.   The AV groove circumflex is a large vessel coursing to the AV groove and  gives rise to two obtuse marginal branches.  The AV groove circumflex is  noted to have a 70% lesion between the first and second OM.  The first OM is  a small vessel with a 70% ostial lesion.  The second OM is a large vessel  which bifurcates distally.  There is a stent noted in its proximal portion  which appears to be widely patent.  There  is mild 30-40% narrowing beyond  the stent.   The right coronary artery is a large vessel and is dominant and gives rise  to both PDA and posterolateral branch.  The RCA is diffusely irregular with  30-40% disease throughout the proximal, mid, and distal segments.  The PDA  is a small vessel with a 70% proximal lesion.  The PLA is small with no  significant disease.   Left ventriculogram reveals preserved EF of 50%.   HEMODYNAMICS:  Systemic arterial pressure 135/69, LV systolic pressure  136/14, LVEDP 22.   INTERVENTIONAL PROCEDURE:  Following diagnostic angiography, a 6 [place]  guiding catheter was used to engage the left coronary ostium.  Next, a 0.014  Forte marker wire was advanced through the guiding catheter and used to  cross the mid AV groove circumflex stenotic lesion and measurements were  obtained.  The wire was then positioned in the distal lumen without  difficulty.  Following this, a Medtronic Driver 3 by 18 mm stent was  positioned across the mid AV groove stenotic lesion.  The stent was then  deployed to 10 atmospheres for a  total of approximately 30 seconds.  A  second inflation to 16 atmospheres was performed for a total of 25 seconds.  Follow up angiogram revealed excellent luminal gain with no evidence of  dissection or thrombus.  IV Integrilin was used throughout the case.  Intermittent doses of heparin were used to maintain the ACT between 200 and  300.   Final angiograms revealed less than 10% residual stenosis in the AV groove  circumflex with TIMI III flow to the distal vessel.  At this point, we  elected to conclude the procedure.  All balloons, wires, and catheters were  removed.  Hemostatic sheath was sewn in place.  The patient was transferred  back to the ward in stable condition.   CONCLUSION:  1.  Successful placement of a Medtronic Driver 3 by 18 mm stent in the mid      AV groove circumflex stenotic lesion.  2.  Patent LIMA to the LAD with competitive flow in the distal LAD.  3.  70% lesion in the proximal PDA which is a small vessel.  4.  Normal LV systolic function.  5.  Elevated LVEDP.  6.  Adjuvant use of Integrilin infusion.      Darlin Priestly, MD  Electronically Signed     RHM/MEDQ  D:  07/15/2005  T:  07/15/2005  Job:  [id number]   cc:   Joycelyn Rua, M.D.  Fax: [phone]   Richard A. Alanda Amass, M.D.  Fax: [phone]

## 2010-10-24 NOTE — Consult Note (Signed)
[name], [name]               ACCOUNT NO.:  [id number]   MEDICAL RECORD NO.:  [id number]          PATIENT TYPE:  INP   LOCATION:  2007                         FACILITY:  [facility]   PHYSICIAN:  [name], M.D.  DATE OF BIRTH:  [birth year]   DATE OF CONSULTATION:  08/17/2005  DATE OF DISCHARGE:                                   CONSULTATION   ELECTROPHYSIOLOGIC CONSULTATION NOTE   CONSULTATION IS REQUESTED BY:  Dr. Lenise Herald.   INDICATION FOR CONSULTATION:  Evaluation of pacemaker infection.   HISTORY OF PRESENT ILLNESS:  The patient is a 77 year old woman, who was  admitted to the hospital with dysuria and urinary tract type symptoms, as  well as fevers and chills, and subsequently found to have methicillin-  sensitive staph aureus bacteremia.  Of note, the patient also had an  associated pacemaker pocket infection, which occurred several weeks after  her initial pacemaker implant.  She is now referred to me for additional  evaluation and treatment.  Of note, the patient denies nausea and vomiting.  There is a question about fevers and chills at home, she is not quite sure  though she does state that she has been sick since her initial pacemaker  implant several weeks ago.  She was admitted for additional evaluation.   PAST MEDICAL HISTORY:  Notable for diabetes, hypertension, dyslipidemia,  coronary disease and hypertension, and atrial fibrillation.  She had  bradycardia prior to her pacemaker implant.   FAMILY HISTORY:  Noncontributory with her advanced age.   SOCIAL HISTORY:  The patient lives with her son.  She denies tobacco or  ethanol use and no drug use.   REVIEW OF SYSTEMS:  Notable for fatigue and weakness, and being sick.  She  denies specific vision or hearing problems.  She denies a headache or  dizziness.  She denies blurring of vision or double vision.  She denies sore  throat, earache or sinus pains.  She denies cough or hemoptysis.  She denies  PND  or orthopnea.  She denied chest pain or shortness of breath.  She denied  nausea, vomiting, diarrhea or constipation, polyuria or polydipsia.  She  does note dysuria.  She denies nocturia and she denies hematuria.  She  denies neurologic problems.  She has had problems in the past with  uncontrolled blood sugars, however.  She denies seizure activity or stroke.  She denies any focal weakness though she does have generalized fatigue and  weakness.   PHYSICAL EXAMINATION:  VITAL SIGNS:  The blood pressure today was 129/75,  the pulse was 80 and regular, respirations were 18 and temperature is 101.8.  GENERAL:  She is a pleasant elderly appearing woman in no acute distress.  HEENT:  Normocephalic, atraumatic.  Pupils equal and round.  The oropharynx  is moist.  The sclerae were anicteric.  NECK:  Revealed no jugular venous distention and there was no thyromegaly.  The trachea was midline.  LUNGS:  Clear bilaterally except for rales in the bases.  There are no  wheezing, rales or rhonchi.  There is no increased  work of breathing.  CARDIOVASCULAR EXAM:  Somewhat distant with an irregular rhythm with normal  S1 and S2.  The PMI was laterally displaced.  The pacemaker pocket incision  demonstrated a small amount of fluctuantes, tenderness and drainage from  both the medial and the lateral aspect of the incision.  EXTREMITIES:  No cyanosis, clubbing or edema.  NEUROLOGIC EXAM:  Alert and oriented x3 with cranial nerves intact.  The  strength was 5/5 and symmetric.   The EKG demonstrates atrial fibrillation with a controlled ventricular  response.   IMPRESSION:  1.  Pacemaker pocket infection with methicillin-sensitive staph aureus      bacteremia.  2.  Symptomatic tachybrady syndrome.  3.  Paroxysmal atrial fibrillation.  4.  Coronary disease status post angioplasty with bare-metal stenting.  5.  Obesity.  6.  Diabetes.   DISCUSSION:  I discussed the treatment options with the patient.   Pacemaker  pocket evacuation is warranted.  The risks, benefits, goals and  complications of the procedure had been discussed with the patient and she  wishes to proceed.  This will be scheduled at the earliest possible  convenient time.           ______________________________  [name], M.D.     GWT/MEDQ  D:  08/17/2005  T:  08/18/2005  Job:  [id number]   cc:   Darlin Priestly, MD  Fax: [phone]   Joycelyn Rua, M.D.  Fax: [phone]

## 2011-03-04 LAB — CBC
HCT: 34.2 — ABNORMAL LOW
HCT: 35.7 — ABNORMAL LOW
Hemoglobin: 11.8 — ABNORMAL LOW
MCHC: 34.1
MCV: 89.5
MCV: 89.5
MCV: 90
Platelets: 230
Platelets: 244
Platelets: 257
RDW: 16.3 — ABNORMAL HIGH
WBC: 7.5

## 2011-03-04 LAB — BASIC METABOLIC PANEL
BUN: 16
BUN: 29 — ABNORMAL HIGH
CO2: 26
CO2: 27
CO2: 30
Calcium: 9.3
Calcium: 9.4
Calcium: 9.7
Chloride: 103
Chloride: 99
Creatinine, Ser: 1.15
Creatinine, Ser: 1.28 — ABNORMAL HIGH
Creatinine, Ser: 1.37 — ABNORMAL HIGH
Creatinine, Ser: 1.51 — ABNORMAL HIGH
GFR calc Af Amer: 45 — ABNORMAL LOW
Glucose, Bld: 104 — ABNORMAL HIGH
Glucose, Bld: 131 — ABNORMAL HIGH
Glucose, Bld: 96

## 2011-03-04 LAB — CARDIAC PANEL(CRET KIN+CKTOT+MB+TROPI)
CK, MB: 0.9
CK, MB: 1.2
Relative Index: INVALID
Relative Index: INVALID
Total CK: 55
Troponin I: 0.01
Troponin I: 0.01
Troponin I: 0.02

## 2011-03-04 LAB — DIFFERENTIAL
Basophils Absolute: 0
Basophils Relative: 1
Eosinophils Relative: 2
Monocytes Absolute: 0.8

## 2011-03-04 LAB — LIPID PANEL
Cholesterol: 156
HDL: 40
LDL Cholesterol: 92
Total CHOL/HDL Ratio: 3.9
Triglycerides: 122

## 2011-03-04 LAB — MAGNESIUM: Magnesium: 2.5

## 2011-03-04 LAB — APTT: aPTT: 43 — ABNORMAL HIGH

## 2011-03-04 LAB — COMPREHENSIVE METABOLIC PANEL
AST: 14
Albumin: 3.5
Alkaline Phosphatase: 73
BUN: 13
CO2: 28
Chloride: 105
Creatinine, Ser: 1.1
GFR calc Af Amer: 58 — ABNORMAL LOW
GFR calc non Af Amer: 48 — ABNORMAL LOW
Potassium: 3.6
Total Bilirubin: 1.3 — ABNORMAL HIGH

## 2011-03-04 LAB — PROTIME-INR
Prothrombin Time: 28.9 — ABNORMAL HIGH
Prothrombin Time: 36.8 — ABNORMAL HIGH

## 2011-03-04 LAB — B-NATRIURETIC PEPTIDE (CONVERTED LAB): Pro B Natriuretic peptide (BNP): 173 — ABNORMAL HIGH

## 2011-03-04 LAB — CK TOTAL AND CKMB (NOT AT ARMC): CK, MB: 1.1

## 2011-03-05 LAB — APTT: aPTT: 55 — ABNORMAL HIGH

## 2011-03-05 LAB — PROTIME-INR
INR: 1.3
INR: 3 — ABNORMAL HIGH
INR: 3.2 — ABNORMAL HIGH
Prothrombin Time: 16.4 — ABNORMAL HIGH
Prothrombin Time: 25.3 — ABNORMAL HIGH
Prothrombin Time: 34 — ABNORMAL HIGH

## 2011-03-05 LAB — DIFFERENTIAL
Basophils Relative: 1
Lymphocytes Relative: 22
Monocytes Relative: 11
Neutro Abs: 5.3
Neutrophils Relative %: 64

## 2011-03-05 LAB — CBC
HCT: 33.4 — ABNORMAL LOW
Hemoglobin: 10.9 — ABNORMAL LOW
Hemoglobin: 10.9 — ABNORMAL LOW
Hemoglobin: 11.2 — ABNORMAL LOW
MCHC: 33.3
MCHC: 33.4
MCHC: 33.5
MCV: 90.2
Platelets: 255
RBC: 3.64 — ABNORMAL LOW
RBC: 3.66 — ABNORMAL LOW
RDW: 16.2 — ABNORMAL HIGH
WBC: 5.8
WBC: 8.3

## 2011-03-05 LAB — BASIC METABOLIC PANEL
CO2: 27
CO2: 28
Calcium: 9.6
Calcium: 9.8
Creatinine, Ser: 1.18
GFR calc Af Amer: 54 — ABNORMAL LOW
GFR calc Af Amer: 58 — ABNORMAL LOW
GFR calc non Af Amer: 44 — ABNORMAL LOW
Sodium: 140
Sodium: 140

## 2011-03-05 LAB — URINE MICROSCOPIC-ADD ON

## 2011-03-05 LAB — URINALYSIS, ROUTINE W REFLEX MICROSCOPIC
Bilirubin Urine: NEGATIVE
Hgb urine dipstick: NEGATIVE
Ketones, ur: NEGATIVE
Nitrite: NEGATIVE
Protein, ur: NEGATIVE
Urobilinogen, UA: 1

## 2011-03-05 LAB — URINE CULTURE: Colony Count: >=100000

## 2011-03-05 LAB — HEMOGLOBIN A1C: Mean Plasma Glucose: 179

## 2011-03-05 LAB — HOMOCYSTEINE: Homocysteine: 23.1 — ABNORMAL HIGH

## 2013-04-07 ENCOUNTER — Encounter: Payer: Self-pay | Admitting: Internal Medicine

## 2013-04-07 ENCOUNTER — Telehealth: Payer: Self-pay | Admitting: Internal Medicine

## 2013-04-07 NOTE — Telephone Encounter (Signed)
04-07-13 pt number is d/c, will send letter to schedule fu device check with Graciela Husbands, former [facility] pt/mt

## 2013-11-15 ENCOUNTER — Encounter: Payer: Self-pay | Admitting: *Deleted

## 2013-11-16 ENCOUNTER — Telehealth: Payer: Self-pay | Admitting: Cardiology

## 2013-11-16 NOTE — Telephone Encounter (Signed)
Certified letter sent 

## 2015-10-07 DEATH — deceased

## 2015-12-02 ENCOUNTER — Telehealth: Payer: Self-pay | Admitting: Cardiovascular Disease

## 2015-12-02 NOTE — Telephone Encounter (Signed)
Just wanted you to know pt passed away on 03/20/16. Thank you for everything.

## 2015-12-02 NOTE — Telephone Encounter (Signed)
Will make dr kelly aware 

## 2015-12-04 NOTE — Telephone Encounter (Signed)
Thanks for letting me know!
